# Patient Record
Sex: Female | Born: 1975 | Race: White | Hispanic: No | Marital: Single | Smoking: Never smoker
Health system: Southern US, Community
[De-identification: ages and names within clinical notes are randomized; demographics above are authoritative.]

## PROBLEM LIST (undated history)

## (undated) DIAGNOSIS — F32A Depression, unspecified: Secondary | ICD-10-CM

## (undated) DIAGNOSIS — F329 Major depressive disorder, single episode, unspecified: Secondary | ICD-10-CM

## (undated) HISTORY — DX: Major depressive disorder, single episode, unspecified: F32.9

## (undated) HISTORY — DX: Depression, unspecified: F32.A

---

## 2000-03-17 ENCOUNTER — Other Ambulatory Visit: Admission: RE | Admit: 2000-03-17 | Discharge: 2000-03-17 | Payer: Self-pay | Admitting: Emergency Medicine

## 2000-05-08 ENCOUNTER — Other Ambulatory Visit: Admission: RE | Admit: 2000-05-08 | Discharge: 2000-05-08 | Payer: Self-pay | Admitting: Obstetrics and Gynecology

## 2000-10-02 ENCOUNTER — Encounter: Admission: RE | Admit: 2000-10-02 | Discharge: 2000-10-02 | Payer: Self-pay | Admitting: Family Medicine

## 2000-10-02 ENCOUNTER — Encounter: Payer: Self-pay | Admitting: Family Medicine

## 2001-08-16 ENCOUNTER — Other Ambulatory Visit: Admission: RE | Admit: 2001-08-16 | Discharge: 2001-08-16 | Payer: Self-pay | Admitting: Obstetrics and Gynecology

## 2002-05-09 ENCOUNTER — Other Ambulatory Visit: Admission: RE | Admit: 2002-05-09 | Discharge: 2002-05-09 | Payer: Self-pay | Admitting: Obstetrics and Gynecology

## 2003-03-27 ENCOUNTER — Other Ambulatory Visit: Admission: RE | Admit: 2003-03-27 | Discharge: 2003-03-27 | Payer: Self-pay | Admitting: Obstetrics and Gynecology

## 2003-10-24 ENCOUNTER — Other Ambulatory Visit: Admission: RE | Admit: 2003-10-24 | Discharge: 2003-10-24 | Payer: Self-pay | Admitting: Obstetrics and Gynecology

## 2004-04-23 ENCOUNTER — Other Ambulatory Visit: Admission: RE | Admit: 2004-04-23 | Discharge: 2004-04-23 | Payer: Self-pay | Admitting: Obstetrics and Gynecology

## 2005-07-17 ENCOUNTER — Encounter: Admission: RE | Admit: 2005-07-17 | Discharge: 2005-07-17 | Payer: Self-pay | Admitting: Family Medicine

## 2012-04-28 ENCOUNTER — Other Ambulatory Visit (HOSPITAL_COMMUNITY): Payer: Self-pay | Admitting: Family Medicine

## 2012-04-28 ENCOUNTER — Other Ambulatory Visit: Payer: Self-pay | Admitting: Family Medicine

## 2012-04-28 ENCOUNTER — Ambulatory Visit
Admission: RE | Admit: 2012-04-28 | Discharge: 2012-04-28 | Disposition: A | Discharge status: Home / Self Care | Payer: PRIVATE HEALTH INSURANCE | Source: Ambulatory Visit | Attending: Family Medicine | Admitting: Family Medicine

## 2012-04-28 ENCOUNTER — Ambulatory Visit (HOSPITAL_COMMUNITY): Payer: PRIVATE HEALTH INSURANCE

## 2012-04-28 DIAGNOSIS — N83209 Unspecified ovarian cyst, unspecified side: Secondary | ICD-10-CM

## 2012-04-28 DIAGNOSIS — R1031 Right lower quadrant pain: Secondary | ICD-10-CM

## 2012-04-28 DIAGNOSIS — N83201 Unspecified ovarian cyst, right side: Secondary | ICD-10-CM

## 2012-04-28 MED ORDER — IOHEXOL 300 MG/ML  SOLN
100.0000 mL | Freq: Once | INTRAMUSCULAR | Status: AC | PRN
  Administered 2012-04-28: 100 mL via INTRAVENOUS

## 2012-04-28 MED ORDER — IOHEXOL 300 MG/ML  SOLN
40.0000 mL | Freq: Once | INTRAMUSCULAR | Status: AC | PRN
  Administered 2012-04-28: 40 mL via ORAL

## 2012-04-29 ENCOUNTER — Ambulatory Visit (HOSPITAL_COMMUNITY): Payer: PRIVATE HEALTH INSURANCE

## 2013-03-08 ENCOUNTER — Other Ambulatory Visit (HOSPITAL_COMMUNITY)
Admission: RE | Admit: 2013-03-08 | Discharge: 2013-03-08 | Disposition: A | Discharge status: Home / Self Care | Payer: BC Managed Care – PPO | Source: Ambulatory Visit | Attending: Obstetrics and Gynecology | Admitting: Obstetrics and Gynecology

## 2013-03-08 ENCOUNTER — Other Ambulatory Visit: Payer: Self-pay | Admitting: Obstetrics and Gynecology

## 2013-03-08 DIAGNOSIS — Z1151 Encounter for screening for human papillomavirus (HPV): Secondary | ICD-10-CM | POA: Insufficient documentation

## 2013-03-08 DIAGNOSIS — Z01419 Encounter for gynecological examination (general) (routine) without abnormal findings: Secondary | ICD-10-CM | POA: Insufficient documentation

## 2013-03-08 DIAGNOSIS — N760 Acute vaginitis: Secondary | ICD-10-CM | POA: Insufficient documentation

## 2013-03-08 DIAGNOSIS — R87810 Cervical high risk human papillomavirus (HPV) DNA test positive: Secondary | ICD-10-CM | POA: Insufficient documentation

## 2014-05-15 IMAGING — CT CT ABD-PELV W/ CM
2 of 4 series · 10 of 36 positions shown, 17 images · IV contrast (OMNI 300, WATER & [ID] OMNI 300)
Comparison: None.

CLINICAL DATA: Right lower quadrant pain.

CT ABDOMEN AND PELVIS WITH CONTRAST
TECHNIQUE: Multidetector CT imaging of the abdomen and pelvis was
performed following the standard protocol during bolus
administration of intravenous contrast.
Contrast: 40mL OMNIPAQUE IOHEXOL 300 MG/ML  SOLN, 100mL OMNIPAQUE
IOHEXOL 300 MG/ML  SOLN

[Series 3: abd/pelvis with · axial · 0.66mm/px · z∈[-272,+18]mm · 9 of 74 slices shown, 15 images]
[im 8/74  soft-tissue]
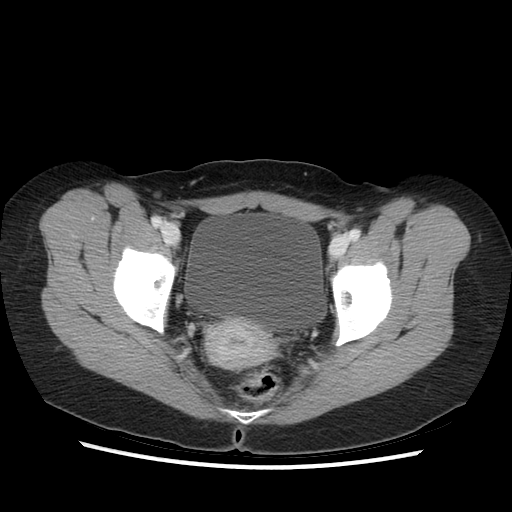
[im 8/74  bone]
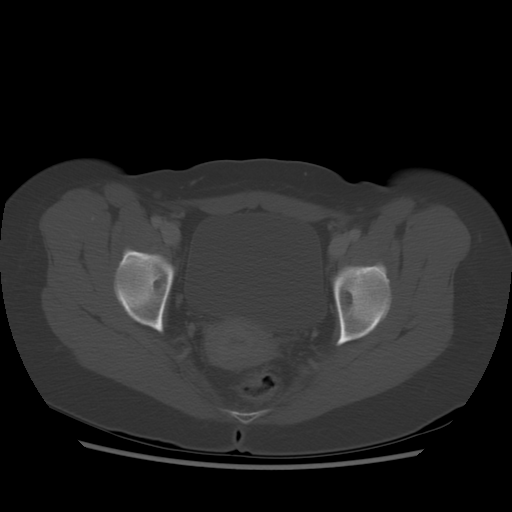
[im 15/74  soft-tissue]
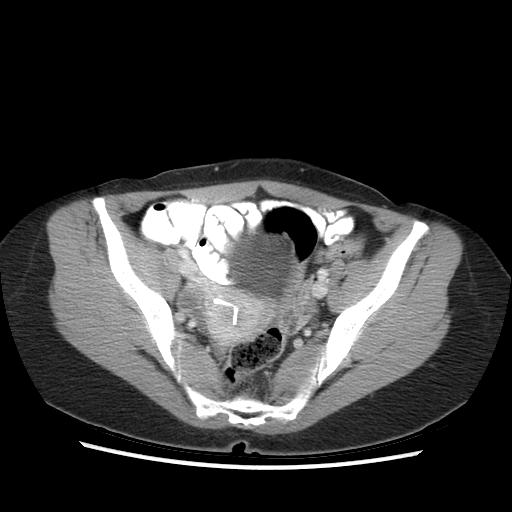
[im 22/74  soft-tissue]
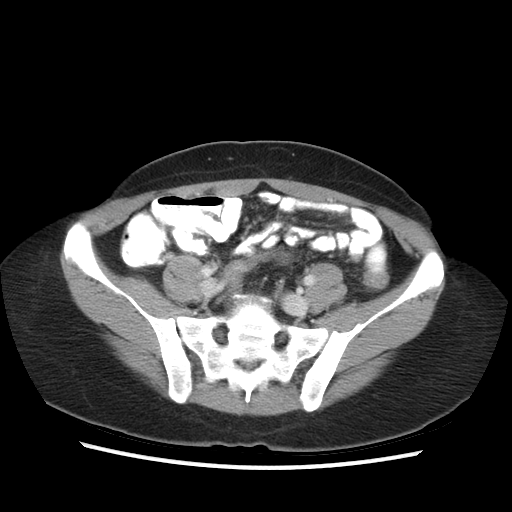
[im 30/74  soft-tissue]
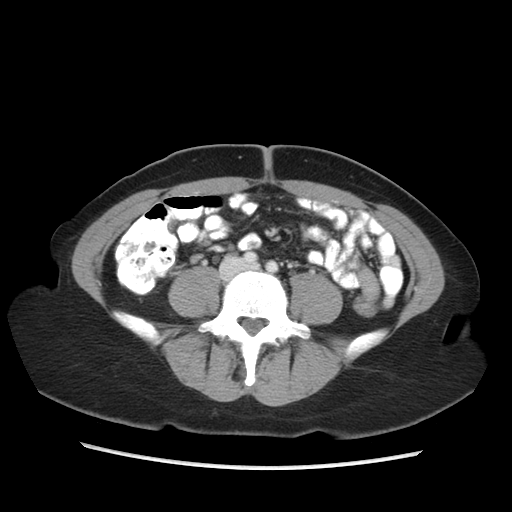
[im 37/74  soft-tissue]
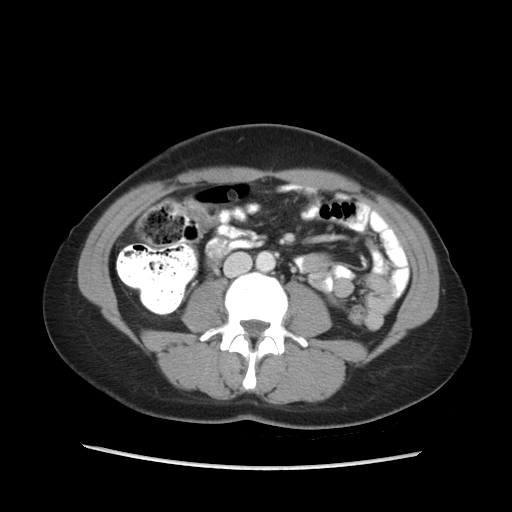
[im 44/74  soft-tissue]
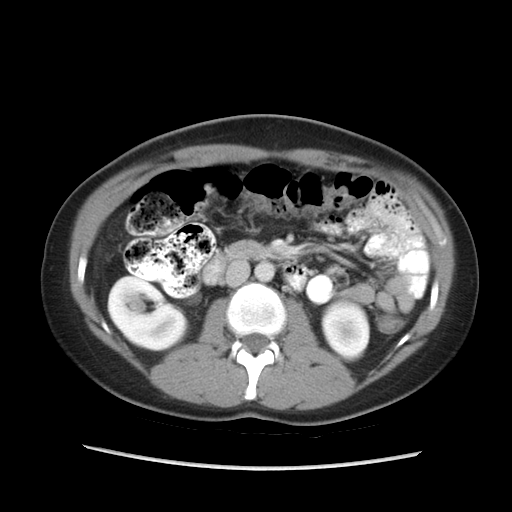
[im 44/74  lung]
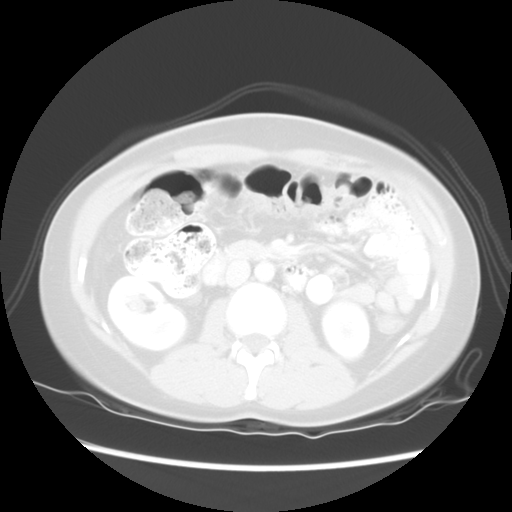
[im 52/74  soft-tissue]
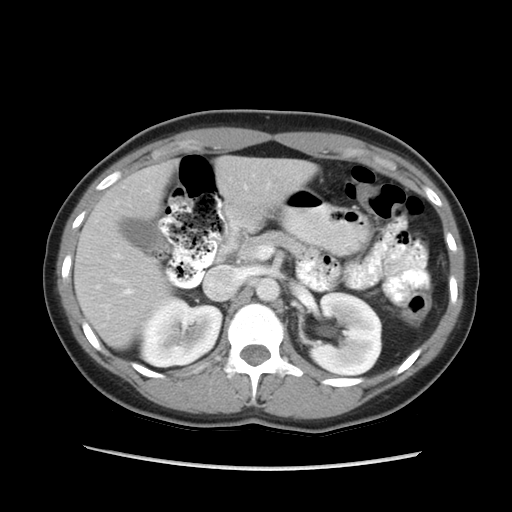
[im 52/74  lung]
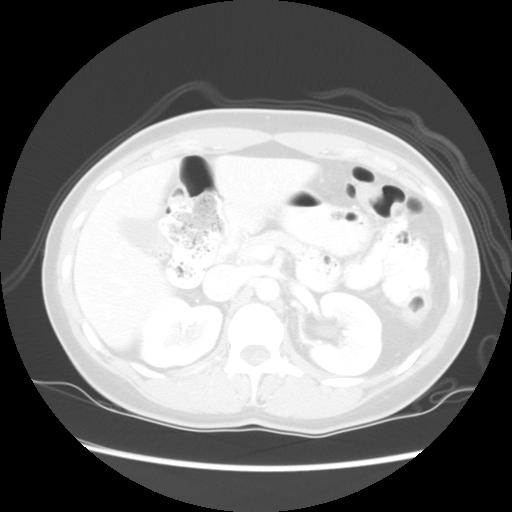
[im 59/74  soft-tissue]
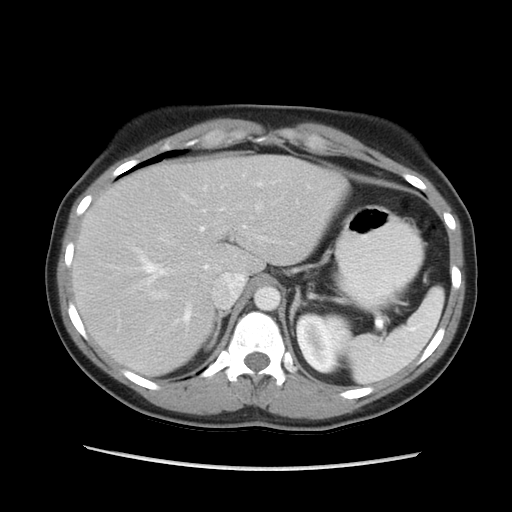
[im 59/74  lung]
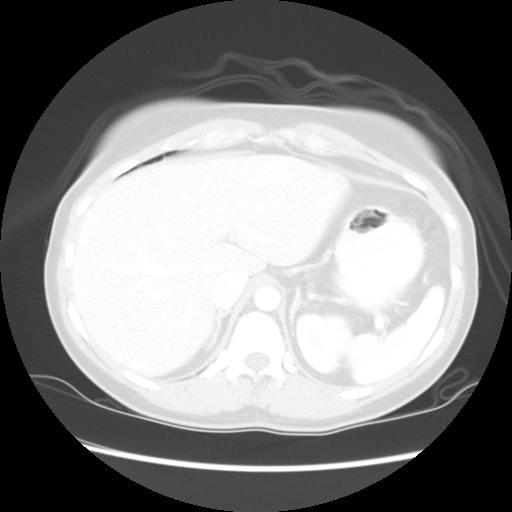
[im 66/74  soft-tissue]
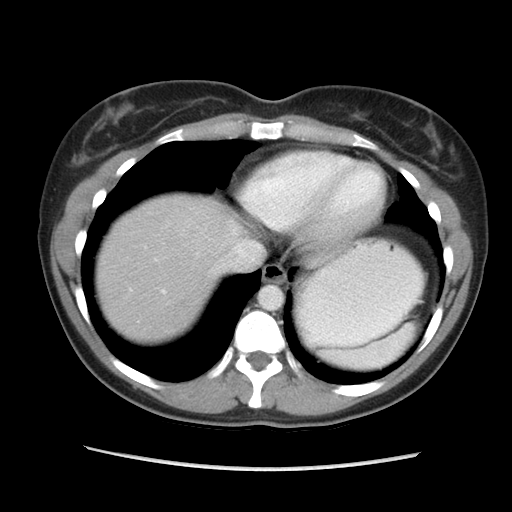
[im 66/74  lung]
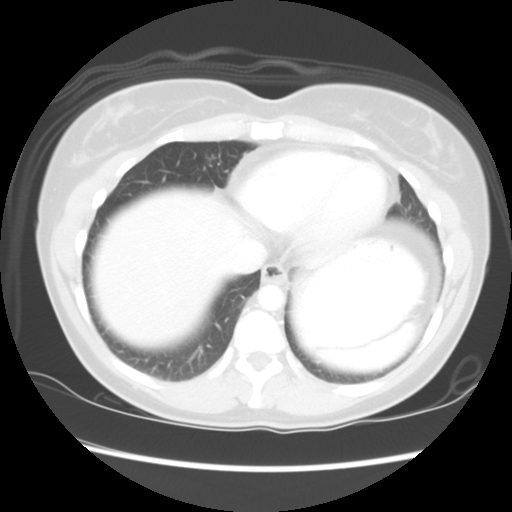
[im 66/74  bone]
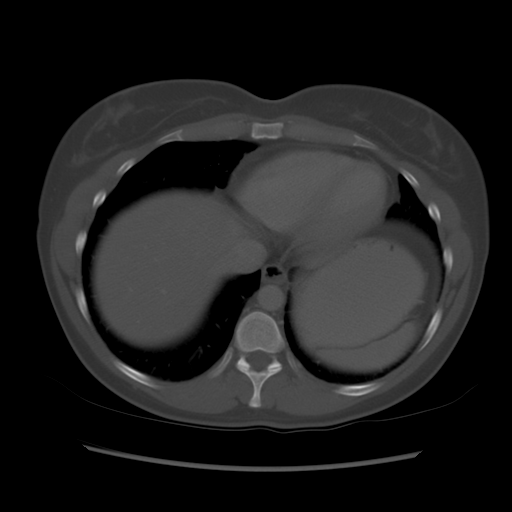

[Series 601: coronal body · coronal · 0.85mm/px · 1 of 106 slices shown, 2 images]
[im 36/106  soft-tissue]
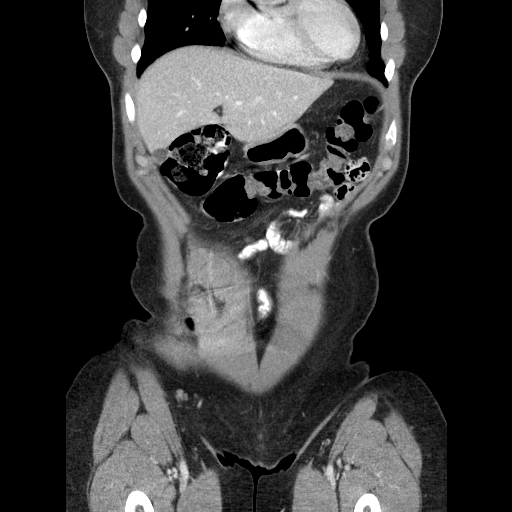
[im 36/106  bone]
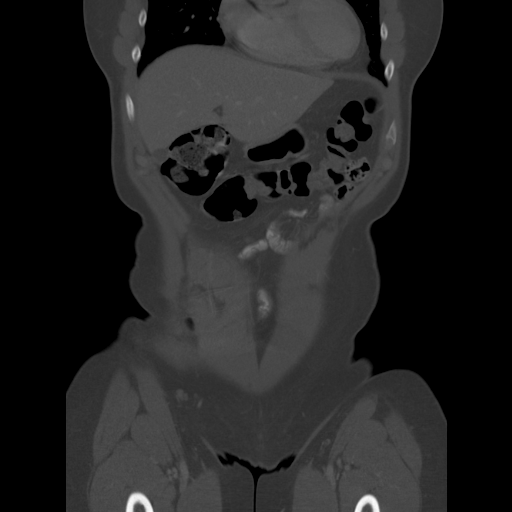

[10 of 36 positions shown; findings below may reference images not displayed]

FINDINGS: The abdominal parenchymal organs are normal in
appearance.  Gallbladder is unremarkable.  No evidence of
hydronephrosis.  No soft tissue masses or lymphadenopathy
identified.

Normal appendix is visualized on the coronal MPRs.  IUD is seen in
normal location in the uterus.  A simple appearing cyst is seen in
the right adnexa measuring approximately 2 x 3 cm, likely
physiologic ovarian cyst.  No evidence of inflammatory process,
bowel wall thickening, or dilatation.
IMPRESSION: 1.  2 x 3 cm right ovarian cyst, likely physiologic.
2.  IUD in normal location.
3.  No evidence of appendicitis or other significant abnormality.

## 2015-01-16 ENCOUNTER — Other Ambulatory Visit (HOSPITAL_COMMUNITY)
Admission: RE | Admit: 2015-01-16 | Discharge: 2015-01-16 | Disposition: A | Discharge status: Home / Self Care | Payer: BLUE CROSS/BLUE SHIELD | Source: Ambulatory Visit | Attending: Obstetrics and Gynecology | Admitting: Obstetrics and Gynecology

## 2015-01-16 ENCOUNTER — Other Ambulatory Visit: Payer: Self-pay | Admitting: Obstetrics and Gynecology

## 2015-01-16 DIAGNOSIS — Z01411 Encounter for gynecological examination (general) (routine) with abnormal findings: Secondary | ICD-10-CM | POA: Insufficient documentation

## 2015-01-16 DIAGNOSIS — Z1151 Encounter for screening for human papillomavirus (HPV): Secondary | ICD-10-CM | POA: Diagnosis present

## 2015-01-16 DIAGNOSIS — R87810 Cervical high risk human papillomavirus (HPV) DNA test positive: Secondary | ICD-10-CM | POA: Diagnosis present

## 2015-01-17 LAB — CYTOLOGY - PAP

## 2015-01-31 ENCOUNTER — Other Ambulatory Visit: Payer: Self-pay | Admitting: Obstetrics and Gynecology

## 2015-05-07 ENCOUNTER — Ambulatory Visit (INDEPENDENT_AMBULATORY_CARE_PROVIDER_SITE_OTHER): Payer: BLUE CROSS/BLUE SHIELD | Admitting: Physician Assistant

## 2015-05-07 VITALS — BP 110/70 | HR 84 | Temp 98.2°F | Resp 18 | Ht 59.0 in | Wt 121.0 lb

## 2015-05-07 DIAGNOSIS — M795 Residual foreign body in soft tissue: Secondary | ICD-10-CM

## 2015-05-07 DIAGNOSIS — L089 Local infection of the skin and subcutaneous tissue, unspecified: Secondary | ICD-10-CM | POA: Diagnosis not present

## 2015-05-07 MED ORDER — DOXYCYCLINE HYCLATE 100 MG PO CAPS: 100.0000 mg | ORAL_CAPSULE | Freq: Two times a day (BID) | ORAL | Status: DC

## 2015-05-07 NOTE — Progress Notes (Signed)
   Subjective:    Patient ID: Stacey [name], female    DOB: 03/13/1976, 39 y.o.   MRN: [id number]  HPI Patient presents to have 3 rings removed from left ring finger that have been stuck for past 2 days. Has been wearing for past week. Denies numbness, tingling, or loss of sensation/function. Finger has gotten red and is painful now. Had tdap 2 years ago. NKDA.   Review of Systems As noted above.    Objective:   Physical Exam  Constitutional: She is oriented to person, place, and time. She appears well-developed and well-nourished. No distress.  Blood pressure 110/70, pulse 84, temperature 98.2 F (36.8 C), temperature source Oral, resp. rate 18, height 4\' 11"  (1.499 m), weight 121 lb (54.885 kg), SpO2 99 %.   HENT:  Head: Normocephalic and atraumatic.  Right Ear: External ear normal.  Left Ear: External ear normal.  Eyes: Conjunctivae are normal. Right eye exhibits no discharge. Left eye exhibits no discharge. No scleral icterus.  Pulmonary/Chest: Effort normal.  Neurological: She is alert and oriented to person, place, and time.  Skin: Skin is warm and dry. No rash noted. She is not diaphoretic. There is erythema. No pallor.  Once rings removed, underlying skin red and green. Rings had embedded into skin so small ulceration without drainage present.   Psychiatric: She has a normal mood and affect. Her behavior is normal. Judgment and thought content normal.   Procedure Consent obtained. Attempt at removing rings with pinrose drain failed. Cut all 3 rings off. Finger thoroughly examined.     Assessment & Plan:  1. Infection of skin of finger 2. Foreign body (FB) in soft tissue Rings removed. Ibuprofen for pain. - doxycycline (VIBRAMYCIN) 100 MG capsule; Take 1 capsule (100 mg total) by mouth 2 (two) times daily.  Dispense: 20 capsule; Refill: 0   Tangia Pinard PA-C  [facility] 05/07/2015 7:16 PM

## 2015-10-03 ENCOUNTER — Other Ambulatory Visit: Payer: Self-pay

## 2015-10-03 DIAGNOSIS — Z1231 Encounter for screening mammogram for malignant neoplasm of breast: Secondary | ICD-10-CM

## 2015-12-10 ENCOUNTER — Ambulatory Visit
Admission: RE | Admit: 2015-12-10 | Discharge: 2015-12-10 | Disposition: A | Discharge status: Home / Self Care | Payer: BLUE CROSS/BLUE SHIELD | Source: Ambulatory Visit

## 2015-12-10 DIAGNOSIS — Z1231 Encounter for screening mammogram for malignant neoplasm of breast: Secondary | ICD-10-CM

## 2016-02-07 ENCOUNTER — Other Ambulatory Visit: Payer: Self-pay | Admitting: Obstetrics and Gynecology

## 2016-02-07 ENCOUNTER — Other Ambulatory Visit (HOSPITAL_COMMUNITY)
Admission: RE | Admit: 2016-02-07 | Discharge: 2016-02-07 | Disposition: A | Discharge status: Home / Self Care | Payer: BLUE CROSS/BLUE SHIELD | Source: Ambulatory Visit | Attending: Obstetrics and Gynecology | Admitting: Obstetrics and Gynecology

## 2016-02-07 DIAGNOSIS — Z01411 Encounter for gynecological examination (general) (routine) with abnormal findings: Secondary | ICD-10-CM | POA: Insufficient documentation

## 2016-02-07 DIAGNOSIS — Z1151 Encounter for screening for human papillomavirus (HPV): Secondary | ICD-10-CM | POA: Diagnosis not present

## 2016-02-08 LAB — CYTOLOGY - PAP

## 2016-09-30 ENCOUNTER — Other Ambulatory Visit (HOSPITAL_COMMUNITY)
Admission: RE | Admit: 2016-09-30 | Discharge: 2016-09-30 | Disposition: A | Discharge status: Home / Self Care | Payer: PRIVATE HEALTH INSURANCE | Source: Ambulatory Visit | Attending: Obstetrics and Gynecology | Admitting: Obstetrics and Gynecology

## 2016-09-30 ENCOUNTER — Other Ambulatory Visit: Payer: Self-pay | Admitting: Obstetrics and Gynecology

## 2016-09-30 DIAGNOSIS — Z1151 Encounter for screening for human papillomavirus (HPV): Secondary | ICD-10-CM | POA: Diagnosis not present

## 2016-09-30 DIAGNOSIS — Z01411 Encounter for gynecological examination (general) (routine) with abnormal findings: Secondary | ICD-10-CM | POA: Insufficient documentation

## 2016-10-03 LAB — CYTOLOGY - PAP
Diagnosis: UNDETERMINED — AB
HPV (WINDOPATH): DETECTED — AB

## 2017-02-16 ENCOUNTER — Other Ambulatory Visit: Payer: Self-pay | Admitting: Obstetrics and Gynecology

## 2017-02-16 ENCOUNTER — Other Ambulatory Visit (HOSPITAL_COMMUNITY)
Admission: RE | Admit: 2017-02-16 | Discharge: 2017-02-16 | Disposition: A | Discharge status: Home / Self Care | Payer: PRIVATE HEALTH INSURANCE | Source: Ambulatory Visit | Attending: Obstetrics and Gynecology | Admitting: Obstetrics and Gynecology

## 2017-02-16 DIAGNOSIS — Z1151 Encounter for screening for human papillomavirus (HPV): Secondary | ICD-10-CM | POA: Insufficient documentation

## 2017-02-16 DIAGNOSIS — Z01411 Encounter for gynecological examination (general) (routine) with abnormal findings: Secondary | ICD-10-CM | POA: Insufficient documentation

## 2017-02-16 DIAGNOSIS — R87810 Cervical high risk human papillomavirus (HPV) DNA test positive: Secondary | ICD-10-CM | POA: Diagnosis present

## 2017-02-19 LAB — CYTOLOGY - PAP
Diagnosis: UNDETERMINED — AB
HPV 16/18/45 GENOTYPING: POSITIVE — AB
HPV: DETECTED — AB

## 2017-03-24 ENCOUNTER — Other Ambulatory Visit: Payer: Self-pay | Admitting: Obstetrics and Gynecology

## 2018-02-18 ENCOUNTER — Other Ambulatory Visit: Payer: Self-pay | Admitting: Obstetrics and Gynecology

## 2018-02-18 ENCOUNTER — Other Ambulatory Visit (HOSPITAL_COMMUNITY)
Admission: RE | Admit: 2018-02-18 | Discharge: 2018-02-18 | Disposition: A | Discharge status: Home / Self Care | Payer: PRIVATE HEALTH INSURANCE | Source: Ambulatory Visit | Attending: Obstetrics and Gynecology | Admitting: Obstetrics and Gynecology

## 2018-02-18 DIAGNOSIS — Z01411 Encounter for gynecological examination (general) (routine) with abnormal findings: Secondary | ICD-10-CM | POA: Insufficient documentation

## 2018-02-23 LAB — CYTOLOGY - PAP
DIAGNOSIS: NEGATIVE
HPV (WINDOPATH): NOT DETECTED

## 2018-08-26 ENCOUNTER — Other Ambulatory Visit: Payer: Self-pay

## 2018-08-26 MED ORDER — BUPROPION HCL ER (SR) 150 MG PO TB12: 150.0000 mg | ORAL_TABLET | ORAL | 0 refills | Status: DC

## 2018-08-26 NOTE — Progress Notes (Signed)
Sent 90 day supply to [facility]

## 2018-12-20 ENCOUNTER — Other Ambulatory Visit: Payer: Self-pay | Admitting: Psychiatry

## 2018-12-20 NOTE — Telephone Encounter (Signed)
Need which medications, which mail order pharmacy and the paper chart not seen in epic

## 2018-12-20 NOTE — Telephone Encounter (Signed)
Pt called stated need refills on all meds mail order. Call [phone] with questions

## 2018-12-21 MED ORDER — BENZTROPINE MESYLATE 1 MG PO TABS: 1.0000 mg | ORAL_TABLET | Freq: Two times a day (BID) | ORAL | 2 refills | Status: DC

## 2018-12-21 MED ORDER — LORAZEPAM 1 MG PO TABS: ORAL_TABLET | ORAL | 0 refills | Status: DC

## 2018-12-21 MED ORDER — ZIPRASIDONE HCL 80 MG PO CAPS: 80.0000 mg | ORAL_CAPSULE | Freq: Two times a day (BID) | ORAL | 2 refills | Status: DC

## 2018-12-21 MED ORDER — BUPROPION HCL ER (XL) 150 MG PO TB24: 150.0000 mg | ORAL_TABLET | Freq: Every day | ORAL | 2 refills | Status: DC

## 2018-12-21 NOTE — Telephone Encounter (Signed)
Needs sent to [facility] and confirmed Benztropine, lorazepam, Zipradisone, and Bupropion XL  Will submit for 90 day and refills pt comes yearly for OV last seen 07/13/2018

## 2018-12-21 NOTE — Telephone Encounter (Signed)
Refill request sent to [facility] for her medications, pt comes yearly for OV

## 2019-05-05 ENCOUNTER — Other Ambulatory Visit: Payer: Self-pay

## 2019-05-05 ENCOUNTER — Telehealth: Payer: Self-pay | Admitting: Psychiatry

## 2019-05-05 MED ORDER — LORAZEPAM 1 MG PO TABS: ORAL_TABLET | ORAL | 0 refills | Status: DC

## 2019-05-05 MED ORDER — BUPROPION HCL ER (XL) 150 MG PO TB24: 150.0000 mg | ORAL_TABLET | Freq: Every day | ORAL | 0 refills | Status: DC

## 2019-05-05 NOTE — Telephone Encounter (Signed)
Patient called and said that she needs refills on her lorazapam 1 mg and her welbutrin 150 mg to be sent to [facility]. She does not have insurance.

## 2019-05-05 NOTE — Telephone Encounter (Signed)
Will pend for approval, not sure when last visit was not seen in epic.

## 2019-06-22 ENCOUNTER — Telehealth: Payer: Self-pay | Admitting: Psychiatry

## 2019-06-22 ENCOUNTER — Other Ambulatory Visit: Payer: Self-pay

## 2019-06-22 MED ORDER — ZIPRASIDONE HCL 80 MG PO CAPS: 80.0000 mg | ORAL_CAPSULE | Freq: Two times a day (BID) | ORAL | 0 refills | Status: DC

## 2019-06-22 NOTE — Telephone Encounter (Signed)
Stacey [name] called to request refill of her Geodon.  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  Has appt 07/12/19.  Please send to [facility]

## 2019-06-22 NOTE — Telephone Encounter (Signed)
Refill submitted. 

## 2019-07-12 ENCOUNTER — Ambulatory Visit: Payer: Self-pay | Admitting: Psychiatry

## 2019-07-19 ENCOUNTER — Other Ambulatory Visit: Payer: Self-pay

## 2019-07-19 ENCOUNTER — Encounter: Payer: Self-pay | Admitting: Psychiatry

## 2019-07-19 ENCOUNTER — Ambulatory Visit (INDEPENDENT_AMBULATORY_CARE_PROVIDER_SITE_OTHER): Payer: Self-pay | Admitting: Psychiatry

## 2019-07-19 DIAGNOSIS — G2589 Other specified extrapyramidal and movement disorders: Secondary | ICD-10-CM

## 2019-07-19 DIAGNOSIS — F411 Generalized anxiety disorder: Secondary | ICD-10-CM

## 2019-07-19 DIAGNOSIS — F203 Undifferentiated schizophrenia: Secondary | ICD-10-CM

## 2019-07-19 DIAGNOSIS — T50905A Adverse effect of unspecified drugs, medicaments and biological substances, initial encounter: Secondary | ICD-10-CM

## 2019-07-19 DIAGNOSIS — F325 Major depressive disorder, single episode, in full remission: Secondary | ICD-10-CM

## 2019-07-19 MED ORDER — BUPROPION HCL ER (XL) 150 MG PO TB24: 150.0000 mg | ORAL_TABLET | Freq: Every day | ORAL | 3 refills | Status: DC

## 2019-07-19 MED ORDER — BENZTROPINE MESYLATE 1 MG PO TABS: 1.0000 mg | ORAL_TABLET | Freq: Two times a day (BID) | ORAL | 3 refills | Status: DC

## 2019-07-19 MED ORDER — ZIPRASIDONE HCL 80 MG PO CAPS: 80.0000 mg | ORAL_CAPSULE | Freq: Two times a day (BID) | ORAL | 3 refills | Status: DC

## 2019-07-19 MED ORDER — LORAZEPAM 1 MG PO TABS: ORAL_TABLET | ORAL | 1 refills | Status: DC
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
## 2020-02-22 ENCOUNTER — Other Ambulatory Visit: Payer: Self-pay | Admitting: Psychiatry

## 2020-02-22 DIAGNOSIS — F411 Generalized anxiety disorder: Secondary | ICD-10-CM

## 2020-02-22 NOTE — Telephone Encounter (Signed)
Next apt 07/18/2020, 90 day supply

## 2020-04-04 ENCOUNTER — Telehealth: Payer: Self-pay | Admitting: Psychiatry

## 2020-04-04 NOTE — Telephone Encounter (Signed)
Patient is scheduled for this Friday at 3: 30pm

## 2020-04-04 NOTE — Telephone Encounter (Signed)
I need to see her as soon as possible. Schedule her as a work in Friday afternoon

## 2020-04-04 NOTE — Telephone Encounter (Signed)
Pt mother called and said that her daughter is really struggling right now. Mother said that she is constantly saying that people are messing with her. Pt is not herself. Please call.

## 2020-04-06 ENCOUNTER — Encounter: Payer: Self-pay | Admitting: Psychiatry

## 2020-04-06 ENCOUNTER — Ambulatory Visit (INDEPENDENT_AMBULATORY_CARE_PROVIDER_SITE_OTHER): Payer: Self-pay | Admitting: Psychiatry

## 2020-04-06 ENCOUNTER — Other Ambulatory Visit: Payer: Self-pay

## 2020-04-06 DIAGNOSIS — F411 Generalized anxiety disorder: Secondary | ICD-10-CM

## 2020-04-06 DIAGNOSIS — G2589 Other specified extrapyramidal and movement disorders: Secondary | ICD-10-CM

## 2020-04-06 DIAGNOSIS — F203 Undifferentiated schizophrenia: Secondary | ICD-10-CM

## 2020-04-06 MED ORDER — OLANZAPINE 5 MG PO TABS: 5.0000 mg | ORAL_TABLET | Freq: Every day | ORAL | 0 refills | Status: DC
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
## 2020-04-10 ENCOUNTER — Telehealth: Payer: Self-pay | Admitting: Psychiatry

## 2020-04-10 NOTE — Telephone Encounter (Signed)
LM x 2 and that I would try to reach her tomorrow. I'm calling the mobile number listed.

## 2020-04-10 NOTE — Telephone Encounter (Signed)
LM for her to call back. I did not block my number so our office # appeared when I called, hopefully she will call back soon.

## 2020-04-10 NOTE — Telephone Encounter (Signed)
Pt called and stated since starting ZYPREXA, things have gotten better but she is still having the same issues. Pt was told to call to update on how she was doing since starting the medication.

## 2020-04-10 NOTE — Telephone Encounter (Signed)
Patient seen on 04/06/2020.  She had been stable on Geodon 80 mg twice daily for years.  Recently he has not been able to eat or sleep well therefore Geodon absorption has been impaired because of her difficulty eating.  Started olanzapine 5 mg nightly to help address those problems.  Expect that we will be temporary.  Please asked the patient if she is now eating well with the ziprasidone and sleeping well due to the olanzapine.  If those are significantly improved then we can wait till the end of the week to see if symptoms resolve.  However if she still having significant problems with sleep or appetite then we should increase olanzapine.

## 2020-04-13 ENCOUNTER — Other Ambulatory Visit: Payer: Self-pay

## 2020-04-13 ENCOUNTER — Ambulatory Visit (INDEPENDENT_AMBULATORY_CARE_PROVIDER_SITE_OTHER): Payer: Self-pay | Admitting: Psychiatry

## 2020-04-13 ENCOUNTER — Encounter: Payer: Self-pay | Admitting: Psychiatry

## 2020-04-13 DIAGNOSIS — F325 Major depressive disorder, single episode, in full remission: Secondary | ICD-10-CM

## 2020-04-13 DIAGNOSIS — F411 Generalized anxiety disorder: Secondary | ICD-10-CM

## 2020-04-13 DIAGNOSIS — F203 Undifferentiated schizophrenia: Secondary | ICD-10-CM
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
## 2020-04-13 NOTE — Patient Instructions (Signed)
Increase olanzapine to 2 of the 5 mg tablets about 2-3 hours before sleep.

## 2020-04-24 ENCOUNTER — Telehealth: Payer: Self-pay | Admitting: Psychiatry

## 2020-04-24 ENCOUNTER — Other Ambulatory Visit: Payer: Self-pay

## 2020-04-24 MED ORDER — OLANZAPINE 10 MG PO TABS: 10.0000 mg | ORAL_TABLET | Freq: Every day | ORAL | 1 refills | Status: DC

## 2020-04-24 NOTE — Telephone Encounter (Signed)
Rx updated to Zyprexa 10 mg #[facility]

## 2020-04-24 NOTE — Telephone Encounter (Signed)
Patient called and said that she needs a refill on her zyprexa. She saw Dr. Jennelle Human on 5/21 and he increased it to 10 mg. [facility] [address] She is out and her next appt is Friday 6/4

## 2020-04-27 ENCOUNTER — Encounter: Payer: Self-pay | Admitting: Psychiatry

## 2020-04-27 ENCOUNTER — Ambulatory Visit (INDEPENDENT_AMBULATORY_CARE_PROVIDER_SITE_OTHER): Payer: Self-pay | Admitting: Psychiatry

## 2020-04-27 ENCOUNTER — Other Ambulatory Visit: Payer: Self-pay

## 2020-04-27 DIAGNOSIS — F411 Generalized anxiety disorder: Secondary | ICD-10-CM

## 2020-04-27 DIAGNOSIS — F325 Major depressive disorder, single episode, in full remission: Secondary | ICD-10-CM

## 2020-04-27 DIAGNOSIS — F203 Undifferentiated schizophrenia: Secondary | ICD-10-CM
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
## 2020-04-30 ENCOUNTER — Other Ambulatory Visit: Payer: Self-pay | Admitting: Psychiatry

## 2020-04-30 ENCOUNTER — Telehealth: Payer: Self-pay | Admitting: Psychiatry

## 2020-04-30 NOTE — Telephone Encounter (Signed)
[facility], but she doesn't need a 90 day supply. They told her we should send in a new script for 20mg  and indicate she does not need a 90 day.  She said she will be stopping the medication and doesn't need 90 day.  This doesn't make any sense.  She doesn't have insurance.  Why are they requiring 90 day?  And why would we increase the mg if she is going to go off the medication?  Either way, the pharmacy is not filling [facility] prescription[facility] [address]

## 2020-04-30 NOTE — Telephone Encounter (Signed)
Since she does not have insurance [facility]!  They will overcharged her.  She needs to pick [facility] any other pharmacy except [facility] and we can send in prescription for olanzapine 10 mg 1 nightly #30 The patient remains paranoid and may be misunderstanding some of what she is hearing from the pharmacist.

## 2020-04-30 NOTE — Telephone Encounter (Signed)
LM with details and to call back with a different pharmacy

## 2020-05-01 ENCOUNTER — Encounter: Payer: Self-pay | Admitting: Psychiatry

## 2020-05-01 ENCOUNTER — Other Ambulatory Visit: Payer: Self-pay

## 2020-05-01 MED ORDER — OLANZAPINE 10 MG PO TABS: 10.0000 mg | ORAL_TABLET | Freq: Every day | ORAL | 1 refills | Status: DC

## 2020-05-01 NOTE — Telephone Encounter (Signed)
PT lm to send rx for to [facility] in [place].

## 2020-05-01 NOTE — Telephone Encounter (Signed)
RX sent to [facility].

## 2020-05-11 ENCOUNTER — Other Ambulatory Visit: Payer: Self-pay

## 2020-05-11 ENCOUNTER — Ambulatory Visit (INDEPENDENT_AMBULATORY_CARE_PROVIDER_SITE_OTHER): Payer: Self-pay | Admitting: Psychiatry

## 2020-05-11 ENCOUNTER — Encounter: Payer: Self-pay | Admitting: Psychiatry

## 2020-05-11 DIAGNOSIS — F203 Undifferentiated schizophrenia: Secondary | ICD-10-CM

## 2020-05-11 DIAGNOSIS — F411 Generalized anxiety disorder: Secondary | ICD-10-CM

## 2020-05-11 DIAGNOSIS — F325 Major depressive disorder, single episode, in full remission: Secondary | ICD-10-CM
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
## 2020-05-24 ENCOUNTER — Telehealth: Payer: Self-pay

## 2020-05-24 NOTE — Telephone Encounter (Signed)
Mom, Marylene Land called very concerned and she knows [place] has apt tomorrow. She does not want patient to know she called though. Presently living with Mom and Dad and they hardly even see her, she stays in her room and comes out occasionally to get food or do her laundry. Sometimes she will speak to them sometimes not a word. If they offer her to have a meal with them she may eat very little portions but will not sit with them at the table. Remains extremely paranoid, doesn't trust anyone. Mom is concerned she's not eating enough with her medication, she assumes she is taking it. Patient tells Mom she is. Patient says " I am eating, I gained so much weight" Mom just wants to know when they should see improvement, months? They just want her to get better. She's obviously not able to work nor would even be able to go to job interview. Patient gets panicky if she is around anyone. Occasionally will tell her Dad's car, they think she goes to a fast food place but unsure.

## 2020-05-25 ENCOUNTER — Ambulatory Visit: Payer: Self-pay | Admitting: Psychiatry

## 2020-05-25 ENCOUNTER — Encounter: Payer: Self-pay | Admitting: Psychiatry

## 2020-05-25 ENCOUNTER — Other Ambulatory Visit: Payer: Self-pay

## 2020-05-25 ENCOUNTER — Ambulatory Visit (INDEPENDENT_AMBULATORY_CARE_PROVIDER_SITE_OTHER): Payer: Self-pay | Admitting: Psychiatry

## 2020-05-25 DIAGNOSIS — F411 Generalized anxiety disorder: Secondary | ICD-10-CM

## 2020-05-25 DIAGNOSIS — F203 Undifferentiated schizophrenia: Secondary | ICD-10-CM

## 2020-05-25 MED ORDER — OLANZAPINE 20 MG PO TABS: 20.0000 mg | ORAL_TABLET | Freq: Every day | ORAL | 1 refills | Status: DC
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
## 2020-05-25 NOTE — Patient Instructions (Signed)
Call next Wed if anxiety is not better.

## 2020-06-06 ENCOUNTER — Telehealth: Payer: Self-pay | Admitting: Psychiatry

## 2020-06-06 NOTE — Telephone Encounter (Signed)
Pt left message stating having anxiety problems today. Ask nurse to return call  @ [phone]

## 2020-06-06 NOTE — Telephone Encounter (Signed)
Patient returned your call. Please call her back at [phone]

## 2020-06-06 NOTE — Telephone Encounter (Deleted)
LM to return my call. 

## 2020-06-11 NOTE — Telephone Encounter (Signed)
This note was on the incorrect pt.  Correct pt was addressed.

## 2020-06-12 ENCOUNTER — Telehealth: Payer: Self-pay

## 2020-06-12 NOTE — Telephone Encounter (Signed)
LM for patient to call back with an update on how she's doing.

## 2020-06-13 ENCOUNTER — Other Ambulatory Visit: Payer: Self-pay | Admitting: Psychiatry

## 2020-06-13 NOTE — Telephone Encounter (Signed)
I am sorry the olanzapine is not having more benefit at this time.  We will look at making a more significant change when I see here this week but in the meantime the most helpful thing is probably just to continue the current meds and take the lorazepam for her anxiety.  We could increase the dosage of that temporarily if she thinks it would be helpful and not make her too sleepy.  She could go up to 1 mg 3 times daily.

## 2020-06-13 NOTE — Telephone Encounter (Signed)
Patient called back and reported she's just still so anxious, everywhere she goes and at home. There's no way she could possibly get a job at this time but knows she needs one. She has gained weight from the medication so she is eating. Sleep is better, occasionally she has an issue of trouble falling asleep but not too often. Once she's asleep she does not wake up.   Taking olanzapine 20 mg 2-3 hours before bed. Geodon 80 mg bid, and alprazolam and benztropine as prescribed.   Has apt this Friday 07/23. Recommend any changes?

## 2020-06-13 NOTE — Telephone Encounter (Signed)
Left voicemail with information and to call back with any questions or concerns.

## 2020-06-15 ENCOUNTER — Encounter: Payer: Self-pay | Admitting: Psychiatry

## 2020-06-15 ENCOUNTER — Ambulatory Visit (INDEPENDENT_AMBULATORY_CARE_PROVIDER_SITE_OTHER): Payer: Self-pay | Admitting: Psychiatry

## 2020-06-15 ENCOUNTER — Other Ambulatory Visit: Payer: Self-pay

## 2020-06-15 DIAGNOSIS — F325 Major depressive disorder, single episode, in full remission: Secondary | ICD-10-CM

## 2020-06-15 DIAGNOSIS — F411 Generalized anxiety disorder: Secondary | ICD-10-CM

## 2020-06-15 DIAGNOSIS — F203 Undifferentiated schizophrenia: Secondary | ICD-10-CM
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
## 2020-06-15 NOTE — Patient Instructions (Signed)
Stop Wellbutrin  Reduce caffeine to 2 Dr. Alcus Dad daily

## 2020-06-19 ENCOUNTER — Telehealth: Payer: Self-pay

## 2020-06-19 NOTE — Telephone Encounter (Signed)
error 

## 2020-06-21 ENCOUNTER — Telehealth: Payer: Self-pay | Admitting: Psychiatry

## 2020-06-22 ENCOUNTER — Ambulatory Visit (INDEPENDENT_AMBULATORY_CARE_PROVIDER_SITE_OTHER): Payer: Self-pay | Admitting: Psychiatry

## 2020-06-22 ENCOUNTER — Encounter: Payer: Self-pay | Admitting: Psychiatry

## 2020-06-22 ENCOUNTER — Other Ambulatory Visit: Payer: Self-pay

## 2020-06-22 DIAGNOSIS — F331 Major depressive disorder, recurrent, moderate: Secondary | ICD-10-CM

## 2020-06-22 DIAGNOSIS — F411 Generalized anxiety disorder: Secondary | ICD-10-CM

## 2020-06-22 DIAGNOSIS — F203 Undifferentiated schizophrenia: Secondary | ICD-10-CM

## 2020-06-22 MED ORDER — RISPERIDONE 1 MG PO TABS: ORAL_TABLET | ORAL | 1 refills | Status: DC

## 2020-06-22 NOTE — Telephone Encounter (Signed)
Error
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
## 2020-06-23 ENCOUNTER — Encounter: Payer: Self-pay | Admitting: Psychiatry

## 2020-07-06 ENCOUNTER — Other Ambulatory Visit: Payer: Self-pay

## 2020-07-06 ENCOUNTER — Encounter: Payer: Self-pay | Admitting: Psychiatry

## 2020-07-06 ENCOUNTER — Ambulatory Visit (INDEPENDENT_AMBULATORY_CARE_PROVIDER_SITE_OTHER): Payer: Self-pay | Admitting: Psychiatry

## 2020-07-06 DIAGNOSIS — F411 Generalized anxiety disorder: Secondary | ICD-10-CM

## 2020-07-06 DIAGNOSIS — F203 Undifferentiated schizophrenia: Secondary | ICD-10-CM

## 2020-07-06 DIAGNOSIS — F331 Major depressive disorder, recurrent, moderate: Secondary | ICD-10-CM
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
## 2020-07-11 ENCOUNTER — Telehealth: Payer: Self-pay | Admitting: Psychiatry

## 2020-07-11 NOTE — Telephone Encounter (Signed)
Increase risperidone to 1 mg in the morning and 3 mg nightly for her anxiety.

## 2020-07-11 NOTE — Telephone Encounter (Signed)
Stacey [name] called because she was here Friday and was doing much better, but yesterday and today her anxiety is sky high; didn't sleep well last night.  Doesn't know what to do.  Please call.  Appt 9/3

## 2020-07-11 NOTE — Telephone Encounter (Signed)
Patient aware of increase. Verbalized understanding and will call back if not improving.

## 2020-07-16 ENCOUNTER — Other Ambulatory Visit: Payer: Self-pay

## 2020-07-16 ENCOUNTER — Telehealth: Payer: Self-pay | Admitting: Psychiatry

## 2020-07-16 DIAGNOSIS — F203 Undifferentiated schizophrenia: Secondary | ICD-10-CM

## 2020-07-16 MED ORDER — RISPERIDONE 1 MG PO TABS: ORAL_TABLET | ORAL | 0 refills | Status: DC

## 2020-07-16 NOTE — Telephone Encounter (Signed)
Pt called about Rx for Risperal 1 mg tab 1 AM and 3 @ Night. CC increased night to 3 was 2. Pt at [facility] and needs approval.

## 2020-07-16 NOTE — Telephone Encounter (Signed)
New dose updated for [facility]

## 2020-07-18 ENCOUNTER — Ambulatory Visit: Payer: Self-pay | Admitting: Psychiatry

## 2020-07-27 ENCOUNTER — Ambulatory Visit (INDEPENDENT_AMBULATORY_CARE_PROVIDER_SITE_OTHER): Payer: Self-pay | Admitting: Psychiatry

## 2020-07-27 ENCOUNTER — Encounter: Payer: Self-pay | Admitting: Psychiatry

## 2020-07-27 ENCOUNTER — Other Ambulatory Visit: Payer: Self-pay | Admitting: Psychiatry

## 2020-07-27 ENCOUNTER — Other Ambulatory Visit: Payer: Self-pay

## 2020-07-27 DIAGNOSIS — F203 Undifferentiated schizophrenia: Secondary | ICD-10-CM

## 2020-07-27 DIAGNOSIS — G2589 Other specified extrapyramidal and movement disorders: Secondary | ICD-10-CM

## 2020-07-27 DIAGNOSIS — F411 Generalized anxiety disorder: Secondary | ICD-10-CM

## 2020-07-27 DIAGNOSIS — F5105 Insomnia due to other mental disorder: Secondary | ICD-10-CM

## 2020-07-27 MED ORDER — RISPERIDONE 1 MG PO TABS: ORAL_TABLET | ORAL | 1 refills | Status: DC

## 2020-07-27 MED ORDER — LORAZEPAM 1 MG PO TABS: ORAL_TABLET | ORAL | 0 refills | Status: DC

## 2020-07-27 MED ORDER — ZIPRASIDONE HCL 80 MG PO CAPS: 80.0000 mg | ORAL_CAPSULE | Freq: Two times a day (BID) | ORAL | 0 refills | Status: DC

## 2020-07-27 MED ORDER — BENZTROPINE MESYLATE 1 MG PO TABS: 1.0000 mg | ORAL_TABLET | Freq: Two times a day (BID) | ORAL | 3 refills | Status: DC
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
## 2020-08-13 ENCOUNTER — Other Ambulatory Visit: Payer: Self-pay | Admitting: Psychiatry

## 2020-08-13 DIAGNOSIS — F411 Generalized anxiety disorder: Secondary | ICD-10-CM

## 2020-08-17 ENCOUNTER — Other Ambulatory Visit: Payer: Self-pay

## 2020-08-17 ENCOUNTER — Encounter: Payer: Self-pay | Admitting: Psychiatry

## 2020-08-17 ENCOUNTER — Ambulatory Visit (INDEPENDENT_AMBULATORY_CARE_PROVIDER_SITE_OTHER): Payer: Self-pay | Admitting: Psychiatry

## 2020-08-17 DIAGNOSIS — F200 Paranoid schizophrenia: Secondary | ICD-10-CM

## 2020-08-17 DIAGNOSIS — F203 Undifferentiated schizophrenia: Secondary | ICD-10-CM

## 2020-08-17 DIAGNOSIS — F331 Major depressive disorder, recurrent, moderate: Secondary | ICD-10-CM

## 2020-08-17 DIAGNOSIS — F5105 Insomnia due to other mental disorder: Secondary | ICD-10-CM

## 2020-08-17 DIAGNOSIS — F411 Generalized anxiety disorder: Secondary | ICD-10-CM

## 2020-08-17 DIAGNOSIS — G2589 Other specified extrapyramidal and movement disorders: Secondary | ICD-10-CM

## 2020-08-17 MED ORDER — LORAZEPAM 1 MG PO TABS: ORAL_TABLET | ORAL | 1 refills | Status: DC

## 2020-08-17 MED ORDER — RISPERIDONE 1 MG PO TABS: ORAL_TABLET | ORAL | 1 refills | Status: DC
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
## 2020-09-07 ENCOUNTER — Encounter: Payer: Self-pay | Admitting: Psychiatry

## 2020-09-07 ENCOUNTER — Other Ambulatory Visit: Payer: Self-pay

## 2020-09-07 ENCOUNTER — Ambulatory Visit (INDEPENDENT_AMBULATORY_CARE_PROVIDER_SITE_OTHER): Payer: Self-pay | Admitting: Psychiatry

## 2020-09-07 DIAGNOSIS — F411 Generalized anxiety disorder: Secondary | ICD-10-CM

## 2020-09-07 DIAGNOSIS — F200 Paranoid schizophrenia: Secondary | ICD-10-CM

## 2020-09-07 DIAGNOSIS — F331 Major depressive disorder, recurrent, moderate: Secondary | ICD-10-CM

## 2020-09-07 DIAGNOSIS — F5105 Insomnia due to other mental disorder: Secondary | ICD-10-CM

## 2020-09-07 MED ORDER — ZIPRASIDONE HCL 60 MG PO CAPS: 60.0000 mg | ORAL_CAPSULE | Freq: Two times a day (BID) | ORAL | 0 refills | Status: DC

## 2020-09-07 MED ORDER — BUPROPION HCL ER (XL) 150 MG PO TB24: 150.0000 mg | ORAL_TABLET | Freq: Every day | ORAL | 1 refills | Status: DC

## 2020-09-07 MED ORDER — LORAZEPAM 1 MG PO TABS: ORAL_TABLET | ORAL | 1 refills | Status: DC

## 2020-09-07 MED ORDER — RISPERIDONE 2 MG PO TABS: ORAL_TABLET | ORAL | 1 refills | Status: DC

## 2020-09-07 NOTE — Patient Instructions (Signed)
Change risperidone to 2 mg in the morning and 4 mg at night. At next refill reduce Geodon to 60 mg twice a day
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
## 2020-09-08 ENCOUNTER — Other Ambulatory Visit: Payer: Self-pay

## 2020-09-08 DIAGNOSIS — F411 Generalized anxiety disorder: Secondary | ICD-10-CM

## 2020-09-08 DIAGNOSIS — F200 Paranoid schizophrenia: Secondary | ICD-10-CM

## 2020-09-08 DIAGNOSIS — F331 Major depressive disorder, recurrent, moderate: Secondary | ICD-10-CM

## 2020-09-08 MED ORDER — LORAZEPAM 1 MG PO TABS: ORAL_TABLET | ORAL | 1 refills | Status: DC

## 2020-09-08 MED ORDER — BUPROPION HCL ER (XL) 150 MG PO TB24: 150.0000 mg | ORAL_TABLET | Freq: Every day | ORAL | 1 refills | Status: DC

## 2020-09-08 MED ORDER — ZIPRASIDONE HCL 60 MG PO CAPS: 60.0000 mg | ORAL_CAPSULE | Freq: Two times a day (BID) | ORAL | 0 refills | Status: DC

## 2020-09-08 MED ORDER — RISPERIDONE 2 MG PO TABS: ORAL_TABLET | ORAL | 1 refills | Status: DC

## 2020-09-28 ENCOUNTER — Encounter: Payer: Self-pay | Admitting: Psychiatry

## 2020-09-28 ENCOUNTER — Other Ambulatory Visit: Payer: Self-pay

## 2020-09-28 ENCOUNTER — Ambulatory Visit (INDEPENDENT_AMBULATORY_CARE_PROVIDER_SITE_OTHER): Payer: Self-pay | Admitting: Psychiatry

## 2020-09-28 DIAGNOSIS — F200 Paranoid schizophrenia: Secondary | ICD-10-CM

## 2020-09-28 DIAGNOSIS — F411 Generalized anxiety disorder: Secondary | ICD-10-CM

## 2020-09-28 MED ORDER — ZIPRASIDONE HCL 60 MG PO CAPS: ORAL_CAPSULE | ORAL | 0 refills | Status: DC

## 2020-09-28 NOTE — Patient Instructions (Addendum)
Increase Geodon to 60 mg in AM with breakfast and 2 of the 60 mg capsules with dinner.  Reduce risperidone 2 tablets at night for 3 nights then 1 tablet at night for 4 nights then stop it.
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
## 2020-10-12 ENCOUNTER — Encounter (INDEPENDENT_AMBULATORY_CARE_PROVIDER_SITE_OTHER): Payer: Self-pay

## 2020-10-12 ENCOUNTER — Encounter: Payer: Self-pay | Admitting: Psychiatry

## 2020-10-12 ENCOUNTER — Other Ambulatory Visit: Payer: Self-pay

## 2020-10-12 ENCOUNTER — Ambulatory Visit (INDEPENDENT_AMBULATORY_CARE_PROVIDER_SITE_OTHER): Payer: Self-pay | Admitting: Psychiatry

## 2020-10-12 DIAGNOSIS — F411 Generalized anxiety disorder: Secondary | ICD-10-CM

## 2020-10-12 DIAGNOSIS — F200 Paranoid schizophrenia: Secondary | ICD-10-CM

## 2020-10-12 DIAGNOSIS — F5105 Insomnia due to other mental disorder: Secondary | ICD-10-CM

## 2020-10-12 DIAGNOSIS — F331 Major depressive disorder, recurrent, moderate: Secondary | ICD-10-CM
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
## 2020-11-02 ENCOUNTER — Ambulatory Visit (INDEPENDENT_AMBULATORY_CARE_PROVIDER_SITE_OTHER): Payer: Self-pay | Admitting: Psychiatry

## 2020-11-02 ENCOUNTER — Other Ambulatory Visit: Payer: Self-pay

## 2020-11-02 ENCOUNTER — Encounter: Payer: Self-pay | Admitting: Psychiatry

## 2020-11-02 DIAGNOSIS — F411 Generalized anxiety disorder: Secondary | ICD-10-CM

## 2020-11-02 DIAGNOSIS — F331 Major depressive disorder, recurrent, moderate: Secondary | ICD-10-CM

## 2020-11-02 MED ORDER — TOPIRAMATE 25 MG PO TABS: ORAL_TABLET | ORAL | 1 refills | Status: DC
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
## 2020-11-07 ENCOUNTER — Other Ambulatory Visit: Payer: Self-pay | Admitting: Psychiatry

## 2020-11-07 DIAGNOSIS — F200 Paranoid schizophrenia: Secondary | ICD-10-CM

## 2020-11-07 DIAGNOSIS — F411 Generalized anxiety disorder: Secondary | ICD-10-CM

## 2020-11-26 ENCOUNTER — Encounter: Payer: Self-pay | Admitting: Psychiatry

## 2020-12-18 ENCOUNTER — Telehealth: Payer: Self-pay | Admitting: Psychiatry

## 2020-12-18 NOTE — Telephone Encounter (Signed)
Noted  

## 2020-12-18 NOTE — Telephone Encounter (Signed)
FYI- pt came in to sign a form to revoke any consent she originally gave for parents, Molly Maduro and Crislyn Willbanks.  We are NOT AUTHORIZED to speak with them regarding her PHI. Effective 12/18/20.

## 2021-01-01 ENCOUNTER — Ambulatory Visit (INDEPENDENT_AMBULATORY_CARE_PROVIDER_SITE_OTHER): Payer: Self-pay | Admitting: Psychiatry

## 2021-01-01 ENCOUNTER — Other Ambulatory Visit: Payer: Self-pay

## 2021-01-01 ENCOUNTER — Encounter: Payer: Self-pay | Admitting: Psychiatry

## 2021-01-01 DIAGNOSIS — F331 Major depressive disorder, recurrent, moderate: Secondary | ICD-10-CM

## 2021-01-01 DIAGNOSIS — F200 Paranoid schizophrenia: Secondary | ICD-10-CM

## 2021-01-01 DIAGNOSIS — F411 Generalized anxiety disorder: Secondary | ICD-10-CM

## 2021-01-01 MED ORDER — LORAZEPAM 1 MG PO TABS: ORAL_TABLET | ORAL | 1 refills | Status: DC

## 2021-01-01 MED ORDER — ZIPRASIDONE HCL 60 MG PO CAPS: ORAL_CAPSULE | ORAL | 2 refills | Status: DC

## 2021-01-01 MED ORDER — BUPROPION HCL ER (XL) 150 MG PO TB24: 150.0000 mg | ORAL_TABLET | Freq: Every day | ORAL | 1 refills | Status: DC

## 2021-01-01 MED ORDER — TOPIRAMATE 50 MG PO TABS: ORAL_TABLET | ORAL | 1 refills | Status: DC
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
## 2021-01-30 ENCOUNTER — Telehealth: Payer: Self-pay | Admitting: Psychiatry

## 2021-01-30 NOTE — Telephone Encounter (Signed)
Pt called to report she's having a lot of anxiety and has taken 4 Geodon in a day, but not going to take until Provider advises. Please advise pt if ok to take 4/d in lieu of 3/d. Contact # [phone]. Apt 4/5

## 2021-01-30 NOTE — Telephone Encounter (Signed)
Left message with information but will call back in the morning to make sure she received.     Please add her to cancellation list.

## 2021-01-30 NOTE — Telephone Encounter (Signed)
We cannot go that high in the dose of Geodon.  Have her increase the lorazepam instead to 1 mg 3 times daily and 1:59 at night as needed for sleep.  Also put her on the cancellation list.

## 2021-01-30 NOTE — Telephone Encounter (Signed)
Please review, taking 60 mg Geodon 4/day due to increased anxiety

## 2021-01-31 NOTE — Telephone Encounter (Signed)
Left pt another message with information

## 2021-02-26 ENCOUNTER — Encounter: Payer: Self-pay | Admitting: Psychiatry

## 2021-02-26 ENCOUNTER — Other Ambulatory Visit: Payer: Self-pay

## 2021-02-26 ENCOUNTER — Ambulatory Visit (INDEPENDENT_AMBULATORY_CARE_PROVIDER_SITE_OTHER): Payer: Self-pay | Admitting: Psychiatry

## 2021-02-26 DIAGNOSIS — F200 Paranoid schizophrenia: Secondary | ICD-10-CM
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
## 2021-02-27 ENCOUNTER — Telehealth: Payer: Self-pay | Admitting: Psychiatry

## 2021-02-27 NOTE — Telephone Encounter (Signed)
I spoke with Stacey [name] and let her know.She will start the Caplyta today

## 2021-02-27 NOTE — Telephone Encounter (Signed)
Next visit is 03/15/21. Stacey [name] called the lab where she will get her lab work done. She is self-pay and they told her that it will be $177.00 up front. She can't afford this. Any ideas what she can do? Her phone number is [phone].

## 2021-02-27 NOTE — Telephone Encounter (Signed)
Is there another option for her?

## 2021-02-27 NOTE — Telephone Encounter (Signed)
I was hoping to check her ziprasidone (Geodon) blood level because of poor response.  However the test is too expensive for her.  I gave her samples of Caplyta.  Tell her to start 1 a day and continue the Geodon at the same dose until she sees me.  I had intended to see her in 10 days and gave her a 10-day supply of Caplyta however I think it will be slightly longer before I am able to see her.  Tell her when she gets close to the end of the current supply of samples to come back and pick up more samples of Caplyta. If she benefits from the [facility] we will seek patient assistance for her.

## 2021-02-27 NOTE — Telephone Encounter (Signed)
reviewed

## 2021-03-06 ENCOUNTER — Telehealth: Payer: Self-pay | Admitting: Psychiatry

## 2021-03-06 NOTE — Telephone Encounter (Signed)
Pt would like to know if she can come by tomorrow and pick up samples for Caplyta 42mg  capsules. Please call when available for pick up.

## 2021-03-06 NOTE — Telephone Encounter (Signed)
Samples pulled 1 box. Pt aware.

## 2021-03-15 ENCOUNTER — Ambulatory Visit (INDEPENDENT_AMBULATORY_CARE_PROVIDER_SITE_OTHER): Payer: Self-pay | Admitting: Psychiatry

## 2021-03-15 ENCOUNTER — Encounter: Payer: Self-pay | Admitting: Psychiatry

## 2021-03-15 ENCOUNTER — Other Ambulatory Visit: Payer: Self-pay

## 2021-03-15 DIAGNOSIS — F411 Generalized anxiety disorder: Secondary | ICD-10-CM

## 2021-03-15 DIAGNOSIS — F5105 Insomnia due to other mental disorder: Secondary | ICD-10-CM

## 2021-03-15 DIAGNOSIS — F200 Paranoid schizophrenia: Secondary | ICD-10-CM

## 2021-03-15 DIAGNOSIS — F331 Major depressive disorder, recurrent, moderate: Secondary | ICD-10-CM
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
## 2021-03-18 NOTE — Progress Notes (Signed)
Application for pt. Assistance has been mailed to the pt.  LM for her that is coming to her for her to complete and return to the office.

## 2021-03-19 ENCOUNTER — Other Ambulatory Visit: Payer: Self-pay | Admitting: Psychiatry

## 2021-03-19 DIAGNOSIS — F331 Major depressive disorder, recurrent, moderate: Secondary | ICD-10-CM

## 2021-04-03 ENCOUNTER — Other Ambulatory Visit: Payer: Self-pay | Admitting: Psychiatry

## 2021-04-03 DIAGNOSIS — F411 Generalized anxiety disorder: Secondary | ICD-10-CM

## 2021-04-04 NOTE — Telephone Encounter (Signed)
Apt 5/20

## 2021-04-12 ENCOUNTER — Encounter: Payer: Self-pay | Admitting: Psychiatry

## 2021-04-12 ENCOUNTER — Ambulatory Visit (INDEPENDENT_AMBULATORY_CARE_PROVIDER_SITE_OTHER): Payer: Self-pay | Admitting: Psychiatry

## 2021-04-12 ENCOUNTER — Other Ambulatory Visit: Payer: Self-pay

## 2021-04-12 DIAGNOSIS — F411 Generalized anxiety disorder: Secondary | ICD-10-CM

## 2021-04-12 DIAGNOSIS — F200 Paranoid schizophrenia: Secondary | ICD-10-CM

## 2021-04-12 MED ORDER — ZIPRASIDONE HCL 40 MG PO CAPS: 40.0000 mg | ORAL_CAPSULE | Freq: Two times a day (BID) | ORAL | 0 refills | Status: DC
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
## 2021-05-01 ENCOUNTER — Other Ambulatory Visit: Payer: Self-pay | Admitting: Psychiatry

## 2021-05-01 DIAGNOSIS — F411 Generalized anxiety disorder: Secondary | ICD-10-CM

## 2021-05-01 DIAGNOSIS — F331 Major depressive disorder, recurrent, moderate: Secondary | ICD-10-CM

## 2021-05-06 ENCOUNTER — Ambulatory Visit: Payer: Self-pay | Admitting: Psychiatry

## 2021-05-16 ENCOUNTER — Encounter: Payer: Self-pay | Admitting: Psychiatry

## 2021-05-16 ENCOUNTER — Other Ambulatory Visit: Payer: Self-pay

## 2021-05-16 ENCOUNTER — Ambulatory Visit (INDEPENDENT_AMBULATORY_CARE_PROVIDER_SITE_OTHER): Payer: 59 | Admitting: Psychiatry

## 2021-05-16 DIAGNOSIS — F411 Generalized anxiety disorder: Secondary | ICD-10-CM

## 2021-05-16 DIAGNOSIS — F331 Major depressive disorder, recurrent, moderate: Secondary | ICD-10-CM

## 2021-05-16 DIAGNOSIS — F200 Paranoid schizophrenia: Secondary | ICD-10-CM | POA: Diagnosis not present

## 2021-05-16 DIAGNOSIS — F5105 Insomnia due to other mental disorder: Secondary | ICD-10-CM | POA: Diagnosis not present

## 2021-05-16 DIAGNOSIS — F458 Other somatoform disorders: Secondary | ICD-10-CM

## 2021-05-16 MED ORDER — ZIPRASIDONE HCL 20 MG PO CAPS: 20.0000 mg | ORAL_CAPSULE | Freq: Two times a day (BID) | ORAL | 0 refills | Status: DC

## 2021-05-16 MED ORDER — CLONAZEPAM 0.5 MG PO TABS: ORAL_TABLET | ORAL | 0 refills | Status: DC
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
## 2021-05-16 NOTE — Patient Instructions (Signed)
About the beginning of July reduce the Geodon to 20 mg twice a day  As soon as you are interested, switch lorazepam to clonazepam for anxiety and sleep

## 2021-05-21 ENCOUNTER — Other Ambulatory Visit: Payer: Self-pay | Admitting: Psychiatry

## 2021-05-21 ENCOUNTER — Telehealth: Payer: Self-pay | Admitting: Psychiatry

## 2021-05-21 NOTE — Telephone Encounter (Signed)
Patient lm stating she would like to continue the Lorazepam instead of Xanax. A follow up appointment is scheduled for 7/8. Please advise.

## 2021-05-21 NOTE — Telephone Encounter (Signed)
I need more detail.  She is not on Xanax.  We switched her from lorazepam to clonazepam to see if it would help her anxiety more.  Ask what her concerns are about the clonazepam.  If it is just less effective we could potentially increase the dose.  Please get details.

## 2021-05-21 NOTE — Telephone Encounter (Signed)
Please review

## 2021-05-22 NOTE — Telephone Encounter (Signed)
LVM to return call.

## 2021-05-31 ENCOUNTER — Ambulatory Visit (INDEPENDENT_AMBULATORY_CARE_PROVIDER_SITE_OTHER): Payer: 59 | Admitting: Psychiatry

## 2021-05-31 ENCOUNTER — Other Ambulatory Visit: Payer: Self-pay

## 2021-05-31 ENCOUNTER — Encounter: Payer: Self-pay | Admitting: Psychiatry

## 2021-05-31 DIAGNOSIS — F200 Paranoid schizophrenia: Secondary | ICD-10-CM

## 2021-05-31 DIAGNOSIS — F5105 Insomnia due to other mental disorder: Secondary | ICD-10-CM | POA: Diagnosis not present

## 2021-05-31 DIAGNOSIS — G2589 Other specified extrapyramidal and movement disorders: Secondary | ICD-10-CM

## 2021-05-31 DIAGNOSIS — F411 Generalized anxiety disorder: Secondary | ICD-10-CM

## 2021-05-31 DIAGNOSIS — F331 Major depressive disorder, recurrent, moderate: Secondary | ICD-10-CM

## 2021-05-31 DIAGNOSIS — F458 Other somatoform disorders: Secondary | ICD-10-CM
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
## 2021-05-31 NOTE — Patient Instructions (Addendum)
Reduce Geodon to 1 of 20 mg capsules twice daily Continue Caplyta 1 daily

## 2021-06-11 ENCOUNTER — Telehealth: Payer: Self-pay | Admitting: Psychiatry

## 2021-06-11 ENCOUNTER — Other Ambulatory Visit: Payer: Self-pay

## 2021-06-11 DIAGNOSIS — F200 Paranoid schizophrenia: Secondary | ICD-10-CM

## 2021-06-11 DIAGNOSIS — F411 Generalized anxiety disorder: Secondary | ICD-10-CM

## 2021-06-11 MED ORDER — LORAZEPAM 1 MG PO TABS
ORAL_TABLET | ORAL | 1 refills | Status: DC
Start: 2021-06-11 — End: 2021-08-06

## 2021-06-11 MED ORDER — ZIPRASIDONE HCL 20 MG PO CAPS: 20.0000 mg | ORAL_CAPSULE | Freq: Two times a day (BID) | ORAL | 0 refills | Status: DC

## 2021-06-11 NOTE — Telephone Encounter (Signed)
Bridgett from [employer] called regarding [place] Gange's medications that were called in. She needs clarification on them. Their number is [phone].

## 2021-06-11 NOTE — Telephone Encounter (Signed)
Pended.

## 2021-06-11 NOTE — Telephone Encounter (Signed)
Lameshia called and wants to know the status of her refills on Lorazepam and Geodan. She said the pharmacy has faxed the requests twice and she has called more than once. I dont see a fax for this and dont see any messages from her requesting the refills. Do either of you have a refill request for these medications? Pharmacy is:  [facility] [address], [place] - [address]  Phone:  [phone]  Fax:  [phone]

## 2021-06-11 NOTE — Telephone Encounter (Signed)
Spoke to pharmacy and clarified Rx.

## 2021-06-19 ENCOUNTER — Telehealth: Payer: Self-pay | Admitting: Psychiatry

## 2021-06-19 NOTE — Telephone Encounter (Signed)
Marylene Land patient's mom lm requesting to speak with Traci concerning Prudence's medication. # [id number].

## 2021-06-20 NOTE — Telephone Encounter (Signed)
Pt's Mom was taken off of the consent I believe. Dr. Jennelle Human please advise

## 2021-06-20 NOTE — Telephone Encounter (Signed)
Please review

## 2021-06-23 NOTE — Telephone Encounter (Signed)
I'm not sure but I think she revoked ROI to parents.  I don't know how to check this.

## 2021-06-24 NOTE — Telephone Encounter (Signed)
Thanks for the update.  Pt has ongoing TR paranoia.  We are trying a transition to an injectable since PO meds have failed.  Pt would likely not tolerate clozapine.

## 2021-06-24 NOTE — Telephone Encounter (Signed)
Spoke with Mom on Friday at length concerning pt's condition. Will update phone message with information passed along.

## 2021-06-24 NOTE — Telephone Encounter (Signed)
Pt's Mom, Kai Railsback is not currently on consents to talk to because pt revoked it. I did speak with Mom and let her tell me pt's behavior, Mom is very concerned and needed a lot of reassurance. Marylene Land reports when pt comes in on Friday, August 5 th she will have her sign a release for consent again. Pt does live with her.   Mom reports after pt received the injection at her last office visit, other then that day she has not been out of the house. Mom reports when pt got home from receiving the injection she made comments that the nurse was wearing heels on a dress down day, she also felt her getting the injection was a trick and she shouldn't have gotten it. Mom reports that whatever Dr. Jennelle Human or nurse say she reports back to her Mom, usually an exaggerated version of what was said. Mom also knows pt reports to Dr. Jennelle Human things that are not happening at their home. Mom feels pt's memory is impaired, easily forgetting things that she normally never would; such as passwords. They had to reach out to get her phone reset due to pt not remembering how to get on her phone. On 7/23, Saturday Mom asked pt about taking over her medication. Mom isn't sure she is taking them but pt reports she is. Mom said since taking over her medication she is sleeping so much better. Pt reports to her that her "flow isn't right". Pt is having a lot of paranoia, neighbors play a lot of loud music and that really bothers pt and she gets fixated on the neighbors. Mom reports she has not seen her like this. Pt stays in her room mostly, which she does not even have a TV. Mom reports prior to injection pt was coming out of her room more and even went to a flea market with her parents. Mom reports they are not allowing her to drive at this time and if I felt that was okay. I advised them if her behavior and thoughts are not appropriate she should not be driving. Her parents will drive her to apt on Friday. Mom reports her movements in her  jaw are improved. Pt also needs more Caplyta, she is getting low. Can get samples on Friday at her apt.  Informed Mom I would update Dr. Jennelle Human with information. Mom feels pt needs some type of Case Manger to help them and guide Mom.

## 2021-06-28 ENCOUNTER — Telehealth: Payer: Self-pay | Admitting: Psychiatry

## 2021-06-28 ENCOUNTER — Other Ambulatory Visit: Payer: Self-pay

## 2021-06-28 ENCOUNTER — Encounter: Payer: Self-pay | Admitting: Psychiatry

## 2021-06-28 ENCOUNTER — Ambulatory Visit (INDEPENDENT_AMBULATORY_CARE_PROVIDER_SITE_OTHER): Payer: 59 | Admitting: Psychiatry

## 2021-06-28 DIAGNOSIS — F331 Major depressive disorder, recurrent, moderate: Secondary | ICD-10-CM

## 2021-06-28 DIAGNOSIS — F411 Generalized anxiety disorder: Secondary | ICD-10-CM | POA: Diagnosis not present

## 2021-06-28 MED ORDER — BUPROPION HCL ER (XL) 150 MG PO TB24: 150.0000 mg | ORAL_TABLET | Freq: Every day | ORAL | 1 refills | Status: DC

## 2021-06-28 MED ORDER — SERTRALINE HCL 50 MG PO TABS: ORAL_TABLET | ORAL | 1 refills | Status: DC
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
## 2021-06-28 NOTE — Telephone Encounter (Signed)
Stacey [name], Stacey [name]'s mom called and said that Stacey [name] is having a bad day and getting worse. She states paranoia is getting worse and seeing things that are not there. She is also having memory loss. Stacey [name] number is [phone] or [phone].

## 2021-06-28 NOTE — Patient Instructions (Addendum)
Reduce ziprasidone to 1 each AM for 2 weeks then stop it. Reduce benztropine to 1/2 tablet twice daily for 2 weeks, then stop it Stop Caplyta Start sertraline at 1/2 tablet in the AM for 1 week, then 1 daily

## 2021-06-28 NOTE — Telephone Encounter (Signed)
Stacey [name] has an appt today

## 2021-07-26 ENCOUNTER — Other Ambulatory Visit: Payer: Self-pay

## 2021-07-26 ENCOUNTER — Ambulatory Visit (INDEPENDENT_AMBULATORY_CARE_PROVIDER_SITE_OTHER): Payer: 59

## 2021-07-26 DIAGNOSIS — F203 Undifferentiated schizophrenia: Secondary | ICD-10-CM | POA: Diagnosis not present

## 2021-07-26 NOTE — Progress Notes (Signed)
Nurse Visit:   Pt came in today for her monthly Abilify Maintena 400 mg injection, with her Mother and brought to our treatment room. Both pt and Mom asked what the name of the medication she is getting injected, they report they didn't know. Showed the box and informed them.   Pt given Abilify Maintena 400 mg IM into right upper gluteal area. Pt tolerated well and had no complaints. She reports she is going to set up her next injection in 28 days.   [id number] EXP Apr 2024

## 2021-08-06 ENCOUNTER — Ambulatory Visit (INDEPENDENT_AMBULATORY_CARE_PROVIDER_SITE_OTHER): Payer: 59 | Admitting: Psychiatry

## 2021-08-06 ENCOUNTER — Encounter: Payer: Self-pay | Admitting: Psychiatry

## 2021-08-06 ENCOUNTER — Other Ambulatory Visit: Payer: Self-pay

## 2021-08-06 DIAGNOSIS — F5105 Insomnia due to other mental disorder: Secondary | ICD-10-CM | POA: Diagnosis not present

## 2021-08-06 DIAGNOSIS — F200 Paranoid schizophrenia: Secondary | ICD-10-CM | POA: Diagnosis not present

## 2021-08-06 DIAGNOSIS — F458 Other somatoform disorders: Secondary | ICD-10-CM

## 2021-08-06 DIAGNOSIS — F411 Generalized anxiety disorder: Secondary | ICD-10-CM | POA: Diagnosis not present

## 2021-08-06 DIAGNOSIS — F331 Major depressive disorder, recurrent, moderate: Secondary | ICD-10-CM | POA: Diagnosis not present

## 2021-08-06 MED ORDER — LORAZEPAM 1 MG PO TABS: 1.0000 mg | ORAL_TABLET | Freq: Four times a day (QID) | ORAL | 1 refills | Status: DC

## 2021-08-06 MED ORDER — CYCLOBENZAPRINE HCL 10 MG PO TABS: ORAL_TABLET | ORAL | 1 refills | Status: DC

## 2021-08-06 MED ORDER — TOPIRAMATE 50 MG PO TABS: 50.0000 mg | ORAL_TABLET | Freq: Every day | ORAL | 1 refills | Status: DC

## 2021-08-06 MED ORDER — SERTRALINE HCL 100 MG PO TABS: 100.0000 mg | ORAL_TABLET | Freq: Every day | ORAL | 0 refills | Status: DC

## 2021-08-06 NOTE — Progress Notes (Signed)
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  She is not able to maintain a job now due to the paranoia.    She failed olanzapine and risperidone and higher dose Geodon.  She is very concerned about weight gain.  Her paranoia is a little better after 2 injections of Abilify maintain a 400 mg q. 28 days.  She is getting the medication free at this point.  She is not agitated however.  Her anxiety is still significant but less severe than the last visit after starting sertraline.  She is less depressed than she was at the last visit as well.   Rec pursue disability  She is still having significant bruxism which is bothersome to her.  Change benztropine  bc teeth grinding to cyclobenzaprine 5 mg bid and 10 mg HS  Increase sertraline to 100 mg daily bc still crying a lot, but is less depressed and anxious than before.  Abilify Maintena 400 mg IM q 4 weeks.  Seems to be helping some with less paranoia, but not gone.  Pt agrees to med changes  She wants to  continue lorazepam 1 mg BID and 1-2 mg HS Ativian and avoid trying any other benzodiazepine such as clonazepam which was  suggested previously.    Continue topiramate for HA and weight loss 50 mg daily.  It's helping Disc SE  Limit caffeine to 2 daily.   Discussed potential metabolic side effects associated with atypical antipsychotics, as well as potential risk for movement side effects. Advised pt to contact office if movement side effects occur.   Follow-up 4-weeks  Lynder Parents, MD, DFAPA  Please see After Visit Summary for patient specific instructions.  Future Appointments  Date Time Provider [place]  08/23/2021 10:00 AM CP-NURSE [facility] None  09/10/2021 10:00 AM Cottle, Billey Co., MD [facility] None  10/15/2021 11:00 AM Cottle, Billey Co., MD [facility] None    No orders of the defined types were placed in this encounter.   -------------------------------

## 2021-08-06 NOTE — Patient Instructions (Signed)
  Change benztropine  because of teeth grinding to cyclobenzaprine 5 mg( 1/2 tablet) in AM and dinner and 10 mg (1 tablet) at bedtime  Increase sertraline to 100 mg daily or 2 of the 50 mg tablets

## 2021-08-23 ENCOUNTER — Other Ambulatory Visit: Payer: Self-pay

## 2021-08-23 ENCOUNTER — Ambulatory Visit (INDEPENDENT_AMBULATORY_CARE_PROVIDER_SITE_OTHER): Payer: 59

## 2021-08-23 DIAGNOSIS — F200 Paranoid schizophrenia: Secondary | ICD-10-CM | POA: Diagnosis not present

## 2021-08-23 NOTE — Progress Notes (Signed)
Nurse Note:  Pt arrived for monthly injection of Abilify Maintena 400 mg, pt received IM to her right upper gluteal. Pt has no complaints, she reports she really likes the shot and thinks it works better. She was very pleasant.   Pt scheduled her follow up in 4 weeks, on October 28th.   LOT [id number]

## 2021-09-04 ENCOUNTER — Other Ambulatory Visit: Payer: Self-pay | Admitting: Psychiatry

## 2021-09-04 DIAGNOSIS — F331 Major depressive disorder, recurrent, moderate: Secondary | ICD-10-CM

## 2021-09-10 ENCOUNTER — Other Ambulatory Visit: Payer: Self-pay

## 2021-09-10 ENCOUNTER — Ambulatory Visit (INDEPENDENT_AMBULATORY_CARE_PROVIDER_SITE_OTHER): Payer: 59 | Admitting: Psychiatry

## 2021-09-10 ENCOUNTER — Encounter: Payer: Self-pay | Admitting: Psychiatry

## 2021-09-10 DIAGNOSIS — F5105 Insomnia due to other mental disorder: Secondary | ICD-10-CM

## 2021-09-10 DIAGNOSIS — F458 Other somatoform disorders: Secondary | ICD-10-CM

## 2021-09-10 DIAGNOSIS — F411 Generalized anxiety disorder: Secondary | ICD-10-CM

## 2021-09-10 DIAGNOSIS — G43009 Migraine without aura, not intractable, without status migrainosus: Secondary | ICD-10-CM

## 2021-09-10 DIAGNOSIS — F200 Paranoid schizophrenia: Secondary | ICD-10-CM

## 2021-09-10 DIAGNOSIS — F331 Major depressive disorder, recurrent, moderate: Secondary | ICD-10-CM

## 2021-09-10 DIAGNOSIS — G2589 Other specified extrapyramidal and movement disorders: Secondary | ICD-10-CM

## 2021-09-10 MED ORDER — TOPIRAMATE 50 MG PO TABS: 50.0000 mg | ORAL_TABLET | Freq: Two times a day (BID) | ORAL | 0 refills | Status: DC
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
## 2021-09-18 ENCOUNTER — Ambulatory Visit (INDEPENDENT_AMBULATORY_CARE_PROVIDER_SITE_OTHER): Payer: 59

## 2021-09-18 ENCOUNTER — Other Ambulatory Visit: Payer: Self-pay

## 2021-09-18 DIAGNOSIS — F200 Paranoid schizophrenia: Secondary | ICD-10-CM

## 2021-09-18 NOTE — Progress Notes (Signed)
Nurse Note:    Pt arrived for her monthly injection of Abilify Maintena 400 mg. She was brought back to conference room and has no complaints, she reports this medication is working great for her. Pt received in her right upper gluteal, IM. Pt very pleasant and doing well.  Pt scheduled in 4 weeks for her next injection.  Medication is stored and provided by doctor's office.  LOT [id number] EXP APR 2024

## 2021-09-20 ENCOUNTER — Ambulatory Visit: Payer: 59

## 2021-09-26 ENCOUNTER — Telehealth: Payer: Self-pay | Admitting: Psychiatry

## 2021-09-26 NOTE — Telephone Encounter (Signed)
I do not see the quetiapine on her med list. Unfortunately, I forgot to document that I gave her quetiapine .   I must have given her some samples.  Please verify dose with her.  If she is sleeping too hard means the dose is too high, apparently,  so have her reduce the dose by 50%.  She was having trouble sleeping before quetiapine so it is obviously helping, but a little too well.  Regarding getting the Abilify Maintena injection, I have no objection to her getting it on the day of her visit with me but that needs to be cleared with the nurse, Traci.

## 2021-09-26 NOTE — Telephone Encounter (Signed)
Left pt a message.  Of course she can get the shot at her next apt with Dr. Jennelle Human, I will left her know.

## 2021-09-26 NOTE — Telephone Encounter (Signed)
Note forwarded to Dr. Jennelle Human

## 2021-09-26 NOTE — Telephone Encounter (Signed)
Next visit is 10/15/21. Stacey [name] called and has some questions about her monthly shot of Abilify and about the prescription Quetiapine. She states when she is taking the Quetiapine she feels like she isn't aware of her surroundings. Her phone number is [phone].

## 2021-09-27 NOTE — Telephone Encounter (Signed)
Patient notified, expressed understanding, and is agreeable to plan.

## 2021-09-27 NOTE — Telephone Encounter (Signed)
Routed to Dr. Cottle 

## 2021-09-27 NOTE — Telephone Encounter (Signed)
After talking with the patient and then the patient and her mother together it was determined the medication is probably sertraline, as mom states she is not on quetiapine. It is difficult to get a good history as daughter and mom tell different versions about patient's sleep. Mom states patient rarely sleeps at night and patient says she does sometimes. Patient states the medication in question was increased on 10/5. The directions for the sertraline was to take 1/2 tab for 7 days and then increase to 1 tab.

## 2021-09-27 NOTE — Telephone Encounter (Signed)
Unfortunately I think this is more about the patient's paranoid delusions that her parents are moving things in the room at night while she is asleep then it is about her sleeping too hard.  Sertraline is not particularly sedating.  Tell her she needs more time to adjust to the medication and it should not make her sleep too hard.

## 2021-10-02 ENCOUNTER — Other Ambulatory Visit: Payer: Self-pay | Admitting: Psychiatry

## 2021-10-02 DIAGNOSIS — F331 Major depressive disorder, recurrent, moderate: Secondary | ICD-10-CM

## 2021-10-03 ENCOUNTER — Other Ambulatory Visit: Payer: Self-pay

## 2021-10-03 DIAGNOSIS — F458 Other somatoform disorders: Secondary | ICD-10-CM

## 2021-10-03 DIAGNOSIS — G43009 Migraine without aura, not intractable, without status migrainosus: Secondary | ICD-10-CM

## 2021-10-03 MED ORDER — TOPIRAMATE 100 MG PO TABS: 100.0000 mg | ORAL_TABLET | Freq: Two times a day (BID) | ORAL | 0 refills | Status: DC

## 2021-10-08 ENCOUNTER — Other Ambulatory Visit: Payer: Self-pay

## 2021-10-08 ENCOUNTER — Ambulatory Visit (INDEPENDENT_AMBULATORY_CARE_PROVIDER_SITE_OTHER): Payer: 59

## 2021-10-08 DIAGNOSIS — F200 Paranoid schizophrenia: Secondary | ICD-10-CM
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
## 2021-10-10 ENCOUNTER — Other Ambulatory Visit: Payer: Self-pay | Admitting: Psychiatry

## 2021-10-11 ENCOUNTER — Telehealth: Payer: Self-pay

## 2021-10-11 ENCOUNTER — Other Ambulatory Visit: Payer: Self-pay | Admitting: Psychiatry

## 2021-10-11 MED ORDER — HALOPERIDOL 5 MG PO TABS: 5.0000 mg | ORAL_TABLET | Freq: Every evening | ORAL | 0 refills | Status: DC

## 2021-10-11 MED ORDER — CYCLOBENZAPRINE HCL 10 MG PO TABS: ORAL_TABLET | ORAL | 1 refills | Status: DC

## 2021-10-11 NOTE — Telephone Encounter (Signed)
Patient has had several months of Abilify Maintena 400 mg q. 28 days.  However the paranoia is not improving as would be desired.  Will add Haldol 5 mg tablets 1 nightly.  She would be a good clozapine candidate but is not likely to tolerate the weight gain given her previous statements. Spoke with both the patient and her mother and they agree to the plan.
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
## 2021-10-11 NOTE — Progress Notes (Signed)
Patient has had several months of Abilify Maintena 400 mg q. 28 days.  However the paranoia is not improving as would be desired.  Will add Haldol 5 mg tablets 1 nightly.  She would be a good clozapine candidate but is not likely to tolerate the weight gain given her previous statements. Spoke with both the patient and her mother and they agree to the plan.

## 2021-10-15 ENCOUNTER — Ambulatory Visit: Payer: 59 | Admitting: Psychiatry

## 2021-10-25 ENCOUNTER — Ambulatory Visit: Payer: 59

## 2021-11-01 ENCOUNTER — Other Ambulatory Visit: Payer: Self-pay

## 2021-11-01 ENCOUNTER — Ambulatory Visit (INDEPENDENT_AMBULATORY_CARE_PROVIDER_SITE_OTHER): Payer: 59 | Admitting: Psychiatry

## 2021-11-01 ENCOUNTER — Encounter: Payer: Self-pay | Admitting: Psychiatry

## 2021-11-01 DIAGNOSIS — F331 Major depressive disorder, recurrent, moderate: Secondary | ICD-10-CM | POA: Diagnosis not present

## 2021-11-01 DIAGNOSIS — F458 Other somatoform disorders: Secondary | ICD-10-CM | POA: Diagnosis not present

## 2021-11-01 DIAGNOSIS — F5105 Insomnia due to other mental disorder: Secondary | ICD-10-CM

## 2021-11-01 DIAGNOSIS — F411 Generalized anxiety disorder: Secondary | ICD-10-CM | POA: Diagnosis not present

## 2021-11-01 DIAGNOSIS — G43009 Migraine without aura, not intractable, without status migrainosus: Secondary | ICD-10-CM

## 2021-11-01 DIAGNOSIS — F200 Paranoid schizophrenia: Secondary | ICD-10-CM

## 2021-11-01 MED ORDER — TOPIRAMATE 100 MG PO TABS: 100.0000 mg | ORAL_TABLET | Freq: Two times a day (BID) | ORAL | 0 refills | Status: DC

## 2021-11-01 MED ORDER — SERTRALINE HCL 100 MG PO TABS: 100.0000 mg | ORAL_TABLET | Freq: Every day | ORAL | 0 refills | Status: DC

## 2021-11-01 MED ORDER — LORAZEPAM 1 MG PO TABS: 1.0000 mg | ORAL_TABLET | Freq: Four times a day (QID) | ORAL | 1 refills | Status: DC

## 2021-11-01 MED ORDER — HALOPERIDOL 5 MG PO TABS: 5.0000 mg | ORAL_TABLET | Freq: Every evening | ORAL | 0 refills | Status: DC

## 2021-11-01 MED ORDER — BUPROPION HCL ER (XL) 150 MG PO TB24: 150.0000 mg | ORAL_TABLET | Freq: Every day | ORAL | 2 refills | Status: DC
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
## 2021-11-20 ENCOUNTER — Other Ambulatory Visit: Payer: Self-pay

## 2021-11-20 ENCOUNTER — Encounter: Payer: Self-pay | Admitting: Psychiatry

## 2021-11-20 ENCOUNTER — Ambulatory Visit (INDEPENDENT_AMBULATORY_CARE_PROVIDER_SITE_OTHER): Payer: 59 | Admitting: Psychiatry

## 2021-11-20 DIAGNOSIS — G43009 Migraine without aura, not intractable, without status migrainosus: Secondary | ICD-10-CM | POA: Diagnosis not present

## 2021-11-20 DIAGNOSIS — F5105 Insomnia due to other mental disorder: Secondary | ICD-10-CM

## 2021-11-20 DIAGNOSIS — F331 Major depressive disorder, recurrent, moderate: Secondary | ICD-10-CM

## 2021-11-20 DIAGNOSIS — F411 Generalized anxiety disorder: Secondary | ICD-10-CM | POA: Diagnosis not present

## 2021-11-20 DIAGNOSIS — F458 Other somatoform disorders: Secondary | ICD-10-CM

## 2021-11-20 DIAGNOSIS — F200 Paranoid schizophrenia: Secondary | ICD-10-CM | POA: Diagnosis not present
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
## 2021-11-22 ENCOUNTER — Other Ambulatory Visit: Payer: Self-pay

## 2021-11-22 ENCOUNTER — Ambulatory Visit (INDEPENDENT_AMBULATORY_CARE_PROVIDER_SITE_OTHER): Payer: 59

## 2021-11-22 DIAGNOSIS — F200 Paranoid schizophrenia: Secondary | ICD-10-CM | POA: Diagnosis not present

## 2021-11-22 NOTE — Progress Notes (Signed)
Pt here today for her starter dose of Aristada Initio 675 mg/2.4 ml  injectable suspension and Aristada  882 mg/3.2 mL injectable suspension. Pt is changing from her Abilify Maintena 400 mg injectable suspension. Pt received 2 injections today, the 675 mg was given in the right upper gluteal area. Instructions for use were reviewed as far as preparation prior to injection. Pt tolerated well, she reports it did sting. The injections do have to be administered rapidly. Pt then received the 882 mg in her left upper gluteal area, tolerated well and she also reports it stung too. Advised her to call back with any questions or concerns. I did write down both medications so she could review herself. She did well and she is aware the next injection will only be 1 (one) and is in 28 days.    Aristad Initio 675 MG LOT # [id number], EXP JUL 2023 Aristada 882 mg LOT# [id number] EXP MAR 2024

## 2021-11-28 NOTE — Progress Notes (Signed)
Noted  

## 2021-12-06 ENCOUNTER — Ambulatory Visit (INDEPENDENT_AMBULATORY_CARE_PROVIDER_SITE_OTHER): Payer: 59 | Admitting: Psychiatry

## 2021-12-06 ENCOUNTER — Other Ambulatory Visit: Payer: Self-pay

## 2021-12-06 ENCOUNTER — Encounter: Payer: Self-pay | Admitting: Psychiatry

## 2021-12-06 DIAGNOSIS — T50905A Adverse effect of unspecified drugs, medicaments and biological substances, initial encounter: Secondary | ICD-10-CM

## 2021-12-06 DIAGNOSIS — F411 Generalized anxiety disorder: Secondary | ICD-10-CM

## 2021-12-06 DIAGNOSIS — G2589 Other specified extrapyramidal and movement disorders: Secondary | ICD-10-CM

## 2021-12-06 DIAGNOSIS — F200 Paranoid schizophrenia: Secondary | ICD-10-CM | POA: Diagnosis not present

## 2021-12-06 DIAGNOSIS — F331 Major depressive disorder, recurrent, moderate: Secondary | ICD-10-CM | POA: Diagnosis not present

## 2021-12-06 DIAGNOSIS — F5105 Insomnia due to other mental disorder: Secondary | ICD-10-CM

## 2021-12-06 DIAGNOSIS — G43009 Migraine without aura, not intractable, without status migrainosus: Secondary | ICD-10-CM

## 2021-12-06 DIAGNOSIS — F458 Other somatoform disorders: Secondary | ICD-10-CM

## 2021-12-06 MED ORDER — HALOPERIDOL 5 MG PO TABS: 7.5000 mg | ORAL_TABLET | Freq: Every evening | ORAL | 1 refills | Status: DC

## 2021-12-06 MED ORDER — CYCLOBENZAPRINE HCL 10 MG PO TABS: 10.0000 mg | ORAL_TABLET | Freq: Three times a day (TID) | ORAL | 1 refills | Status: DC

## 2021-12-06 MED ORDER — ABILIFY MAINTENA 400 MG IM PRSY: 400.0000 mg | PREFILLED_SYRINGE | INTRAMUSCULAR | 5 refills | Status: DC
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
## 2021-12-13 ENCOUNTER — Other Ambulatory Visit: Payer: Self-pay

## 2021-12-13 ENCOUNTER — Ambulatory Visit (INDEPENDENT_AMBULATORY_CARE_PROVIDER_SITE_OTHER): Payer: 59

## 2021-12-13 DIAGNOSIS — F200 Paranoid schizophrenia: Secondary | ICD-10-CM | POA: Diagnosis not present

## 2021-12-13 NOTE — Progress Notes (Signed)
Pt called office today requesting to get her Abilify Maintena injection early, she is not due until next week. She received the Aristada Initio 675 mg/2.4 ml  injectable suspension and Aristada  882 mg/3.2 mL injectable suspension on 11/22/2021.  Unfortunately after receiving that she became very tearful and sad, crying all the time for no reason. Dr. Jennelle Human decided to change her back to Abilify Maintena 400 mg due to her side effects. After discussing with Dr. Jennelle Human this morning, he agreed for her to get it early, she is still crying a lot and having some paranoia. Pt arrived with her mother who stayed in the lobby. Pt given Abilify Maintena 400 mg IM in right upper gluteal area. Pt tolerated well. Pt was not her normal happy/cheerful self, she was very tearful the entire time while I got her injection ready. Not speaking much, very withdrawn. She obviously is realizing something is going on with her requesting the injection early and is being proactive hopefully the symptoms will improve.   Her next apt with Dr. Jennelle Human is 01/03/2022.   LOT [id number] EXP FEB 2024

## 2021-12-20 ENCOUNTER — Ambulatory Visit: Payer: 59

## 2022-01-03 ENCOUNTER — Ambulatory Visit (INDEPENDENT_AMBULATORY_CARE_PROVIDER_SITE_OTHER): Payer: 59 | Admitting: Psychiatry

## 2022-01-03 ENCOUNTER — Encounter: Payer: Self-pay | Admitting: Psychiatry

## 2022-01-03 ENCOUNTER — Ambulatory Visit: Payer: 59 | Admitting: Psychiatry

## 2022-01-03 ENCOUNTER — Other Ambulatory Visit: Payer: Self-pay

## 2022-01-03 DIAGNOSIS — F200 Paranoid schizophrenia: Secondary | ICD-10-CM | POA: Diagnosis not present

## 2022-01-03 DIAGNOSIS — F411 Generalized anxiety disorder: Secondary | ICD-10-CM | POA: Diagnosis not present

## 2022-01-03 MED ORDER — HALOPERIDOL 5 MG PO TABS: 10.0000 mg | ORAL_TABLET | Freq: Every evening | ORAL | 0 refills | Status: DC

## 2022-01-03 MED ORDER — LORAZEPAM 1 MG PO TABS: 1.0000 mg | ORAL_TABLET | Freq: Four times a day (QID) | ORAL | 1 refills | Status: DC
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
## 2022-01-17 ENCOUNTER — Other Ambulatory Visit: Payer: Self-pay

## 2022-01-17 ENCOUNTER — Ambulatory Visit (INDEPENDENT_AMBULATORY_CARE_PROVIDER_SITE_OTHER): Payer: 59 | Admitting: Psychiatry

## 2022-01-17 ENCOUNTER — Encounter: Payer: Self-pay | Admitting: Psychiatry

## 2022-01-17 DIAGNOSIS — F411 Generalized anxiety disorder: Secondary | ICD-10-CM

## 2022-01-17 DIAGNOSIS — G43009 Migraine without aura, not intractable, without status migrainosus: Secondary | ICD-10-CM

## 2022-01-17 DIAGNOSIS — F200 Paranoid schizophrenia: Secondary | ICD-10-CM | POA: Diagnosis not present

## 2022-01-17 DIAGNOSIS — F458 Other somatoform disorders: Secondary | ICD-10-CM

## 2022-01-17 DIAGNOSIS — F331 Major depressive disorder, recurrent, moderate: Secondary | ICD-10-CM | POA: Diagnosis not present

## 2022-01-17 DIAGNOSIS — F5105 Insomnia due to other mental disorder: Secondary | ICD-10-CM
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
## 2022-01-24 ENCOUNTER — Ambulatory Visit (INDEPENDENT_AMBULATORY_CARE_PROVIDER_SITE_OTHER): Payer: 59

## 2022-01-24 ENCOUNTER — Other Ambulatory Visit: Payer: Self-pay

## 2022-01-24 DIAGNOSIS — F200 Paranoid schizophrenia: Secondary | ICD-10-CM

## 2022-01-24 NOTE — Progress Notes (Signed)
Pt arrived at office today for her Abilify Maintena 400 mg injectable, she is receiving every 3 weeks and says she is starting to feel better. This is her second injection since restarting. Pt's Mom waited in lobby. Pt received in her right upper gluteal IM, tolerated well. She sees Dr. Jennelle Human next week again. She does not need any refills today. Instructed to call if she needs anything prior to her apt.  ? ? ?LOT [id number] ?EXP AUG 2024 ?

## 2022-01-31 ENCOUNTER — Other Ambulatory Visit: Payer: Self-pay

## 2022-01-31 ENCOUNTER — Ambulatory Visit (INDEPENDENT_AMBULATORY_CARE_PROVIDER_SITE_OTHER): Payer: 59 | Admitting: Psychiatry

## 2022-01-31 ENCOUNTER — Encounter: Payer: Self-pay | Admitting: Psychiatry

## 2022-01-31 DIAGNOSIS — F458 Other somatoform disorders: Secondary | ICD-10-CM

## 2022-01-31 DIAGNOSIS — G43009 Migraine without aura, not intractable, without status migrainosus: Secondary | ICD-10-CM

## 2022-01-31 DIAGNOSIS — F411 Generalized anxiety disorder: Secondary | ICD-10-CM

## 2022-01-31 DIAGNOSIS — F5105 Insomnia due to other mental disorder: Secondary | ICD-10-CM

## 2022-01-31 DIAGNOSIS — F200 Paranoid schizophrenia: Secondary | ICD-10-CM

## 2022-01-31 DIAGNOSIS — F331 Major depressive disorder, recurrent, moderate: Secondary | ICD-10-CM

## 2022-01-31 MED ORDER — HALOPERIDOL 5 MG PO TABS: 15.0000 mg | ORAL_TABLET | Freq: Every evening | ORAL | 1 refills | Status: DC

## 2022-01-31 MED ORDER — BUPROPION HCL ER (XL) 300 MG PO TB24: 300.0000 mg | ORAL_TABLET | Freq: Every day | ORAL | 1 refills | Status: DC

## 2022-01-31 MED ORDER — TOPIRAMATE 100 MG PO TABS: 100.0000 mg | ORAL_TABLET | Freq: Two times a day (BID) | ORAL | 0 refills | Status: DC

## 2022-01-31 NOTE — Patient Instructions (Signed)
Increase Wellbutrin to 300 for depression ?Increase haloperidol to 15 mg or 3 of the 5 mg tablets at night ?
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
## 2022-02-07 ENCOUNTER — Other Ambulatory Visit: Payer: Self-pay | Admitting: Psychiatry

## 2022-02-13 ENCOUNTER — Other Ambulatory Visit: Payer: Self-pay

## 2022-02-13 ENCOUNTER — Ambulatory Visit (INDEPENDENT_AMBULATORY_CARE_PROVIDER_SITE_OTHER): Payer: 59

## 2022-02-13 DIAGNOSIS — F200 Paranoid schizophrenia: Secondary | ICD-10-CM

## 2022-02-13 DIAGNOSIS — F411 Generalized anxiety disorder: Secondary | ICD-10-CM

## 2022-02-13 MED ORDER — SERTRALINE HCL 100 MG PO TABS: 100.0000 mg | ORAL_TABLET | Freq: Every day | ORAL | 0 refills | Status: DC

## 2022-02-13 NOTE — Progress Notes (Signed)
Nurse Note: ? ?Pt arrived for her Abilify Maintena 400 mg IM, her last dose was on 01/24/2022. She was smiling, laughing, looked beautifully dressed. She received IM in right upper gluteal, tolerated well. Her Mom waited in lobby. She asked for a refill of her Sertraline 100 mg. Rx sent to [facility] as requested for a 90 day supply. I asked pt how she was feeling and she reports much better, she definitely presents that way. Also asked if it's wearing off sooner then her 3 week time frame and she reports it does some. Instructed to call if she needs anything or has questions or concerns.  ? ? ?LOT [id number] ?EXP AUG 2024 ?

## 2022-02-14 ENCOUNTER — Ambulatory Visit: Payer: 59

## 2022-02-21 ENCOUNTER — Encounter: Payer: Self-pay | Admitting: Psychiatry

## 2022-02-21 ENCOUNTER — Ambulatory Visit (INDEPENDENT_AMBULATORY_CARE_PROVIDER_SITE_OTHER): Payer: 59 | Admitting: Psychiatry

## 2022-02-21 DIAGNOSIS — F331 Major depressive disorder, recurrent, moderate: Secondary | ICD-10-CM | POA: Diagnosis not present

## 2022-02-21 DIAGNOSIS — F411 Generalized anxiety disorder: Secondary | ICD-10-CM

## 2022-02-21 DIAGNOSIS — F200 Paranoid schizophrenia: Secondary | ICD-10-CM | POA: Diagnosis not present

## 2022-02-21 DIAGNOSIS — F5105 Insomnia due to other mental disorder: Secondary | ICD-10-CM

## 2022-02-21 DIAGNOSIS — G43009 Migraine without aura, not intractable, without status migrainosus: Secondary | ICD-10-CM | POA: Diagnosis not present
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
## 2022-03-03 ENCOUNTER — Ambulatory Visit: Payer: 59

## 2022-03-04 ENCOUNTER — Ambulatory Visit (INDEPENDENT_AMBULATORY_CARE_PROVIDER_SITE_OTHER): Payer: 59

## 2022-03-04 DIAGNOSIS — F200 Paranoid schizophrenia: Secondary | ICD-10-CM

## 2022-03-04 NOTE — Progress Notes (Signed)
Nurse Note: ?  ?Pt arrived for her Abilify Maintena 400 mg IM, her last dose was on 02/13/2022. Her dose has been increased to every 2 weeks temporarily but with the holiday she didn't get at 2 weeks. She received IM in right upper gluteal, tolerated well. Her Mom waited in lobby. Voiced no complaints, was quiet. She didn't have make up on today which is unusual for her. Instructed her to set up apt on 03/25 for next injection but if her follow up with Dr. Jennelle Human was around that time we could do it then to save a trip. She agreed. ?  ?  ?LOT [id number] ?EXP AUG 2024 ?

## 2022-03-08 ENCOUNTER — Other Ambulatory Visit: Payer: Self-pay | Admitting: Psychiatry

## 2022-03-08 DIAGNOSIS — F411 Generalized anxiety disorder: Secondary | ICD-10-CM

## 2022-03-10 NOTE — Telephone Encounter (Signed)
Please send

## 2022-03-18 ENCOUNTER — Ambulatory Visit: Payer: 59

## 2022-03-19 ENCOUNTER — Ambulatory Visit: Payer: 59

## 2022-03-20 ENCOUNTER — Telehealth: Payer: Self-pay | Admitting: Psychiatry

## 2022-03-20 ENCOUNTER — Other Ambulatory Visit: Payer: Self-pay

## 2022-03-20 DIAGNOSIS — F200 Paranoid schizophrenia: Secondary | ICD-10-CM

## 2022-03-20 MED ORDER — ABILIFY MAINTENA 400 MG IM PRSY: 400.0000 mg | PREFILLED_SYRINGE | INTRAMUSCULAR | 5 refills | Status: DC

## 2022-03-20 NOTE — Telephone Encounter (Signed)
Spoke with Mom to let her know I was trying to get an injection. She reports pt is not doing well. She's been crying in her room.  ? ?Informed Mom I would call her back in the morning with an update.  ?

## 2022-03-20 NOTE — Telephone Encounter (Signed)
Pt's mother Stacey [name] called to report pt not doing well. Since Tuesday she has gotten worse. Shot due Tuesday and not available. She is in her room crying. Mother asking if anything can be done to speed up getting injection. Call mom @ [phone] LVM     ?

## 2022-03-20 NOTE — Telephone Encounter (Signed)
I ordered the injection Abilify Maintenna and sent to [facility] along with information for a co-pay savings card. It will not process until her prior authorization is completed. Will submit that and follow up with [employer] for the cost.  ?

## 2022-03-21 NOTE — Telephone Encounter (Signed)
Response from [employer] with [employer] reports a PA can not be reviewed because medication not on her formulary. I contacted her insurance directly [phone] and they advised me to reach out and submit a request for exception to her benefits. Contact # is [phone]. They advised me to go to their website at [email or web address] and print a request form that needs to include any clinical documentation with why pt medically needs medication, any past medications tried, and any other information that would assist in getting the medication added to her formulary and approved. Once received by their dept it will be reviewed with a turn around time of 72 hours.  ? ?Will submit all this information asap but as of right now pt is not able to receive an injection today due to unavailability.  ? ?Will contact Mom with information.  ?

## 2022-03-24 NOTE — Telephone Encounter (Signed)
Stacey [name], would this pt be able to apply for pt. Assistance on her Abilify Maintenna?  ?

## 2022-03-25 ENCOUNTER — Ambulatory Visit (INDEPENDENT_AMBULATORY_CARE_PROVIDER_SITE_OTHER): Payer: 59

## 2022-03-25 DIAGNOSIS — F200 Paranoid schizophrenia: Secondary | ICD-10-CM | POA: Diagnosis not present

## 2022-03-25 NOTE — Progress Notes (Signed)
Nurse Note: ? ? ?Pt arrived for her injection of Abilify Maintena 400 mg. Pt was suppose to receive last Tuesday, April 25th per Dr. Clovis Pu ordering every 2 weeks. Unfortunately there was no medication available at office. An order was placed with the Pharmaceutical Rep but delivery did not come until today, May 2nd. Pt received injection in her Right upper gluteal area IM and tolerated well. Pt reports she has not been out of the house for a week, doing poorly. Mom reported she has been crying and staying in her room. Pt scheduled next injection for May 16th, Tuesday for her 2 week regimen. Pt also will see Dr. Clovis Pu this Friday, May 5th for follow up.  ? ?LOT [id number] ?EXP JUL 2025 ?

## 2022-03-26 ENCOUNTER — Telehealth: Payer: Self-pay

## 2022-03-26 NOTE — Telephone Encounter (Signed)
Medication Management with [employer] ?

## 2022-03-27 NOTE — Telephone Encounter (Signed)
If we can get it once monthly from insurance maybe we can get samples enough to do every 2 weeks for about 8 weeks to see if she will get better.  If not we will need to change meds and that is not likely to go well initially ?

## 2022-03-27 NOTE — Telephone Encounter (Signed)
I submitted a request to get ABILIFY MAINTENA 400 MG IM added to pt's formulary list but the response back was DENIED. This does not meet medical necessity to be given every 2 weeks (per Dr. Casimiro Needle note), there has to be at least 26 days separated by each dose. Therefore this request did not get approved.  ?

## 2022-03-27 NOTE — Telephone Encounter (Signed)
No, I did try to do once a month. I did not put that in for my request but they said your office note said every 2 weeks.  ?

## 2022-03-28 ENCOUNTER — Ambulatory Visit (INDEPENDENT_AMBULATORY_CARE_PROVIDER_SITE_OTHER): Payer: 59 | Admitting: Psychiatry

## 2022-03-28 ENCOUNTER — Telehealth: Payer: Self-pay

## 2022-03-28 ENCOUNTER — Encounter: Payer: Self-pay | Admitting: Psychiatry

## 2022-03-28 DIAGNOSIS — F5105 Insomnia due to other mental disorder: Secondary | ICD-10-CM

## 2022-03-28 DIAGNOSIS — Z79899 Other long term (current) drug therapy: Secondary | ICD-10-CM | POA: Diagnosis not present

## 2022-03-28 DIAGNOSIS — F411 Generalized anxiety disorder: Secondary | ICD-10-CM

## 2022-03-28 DIAGNOSIS — F331 Major depressive disorder, recurrent, moderate: Secondary | ICD-10-CM

## 2022-03-28 DIAGNOSIS — F458 Other somatoform disorders: Secondary | ICD-10-CM

## 2022-03-28 DIAGNOSIS — F200 Paranoid schizophrenia: Secondary | ICD-10-CM | POA: Diagnosis not present

## 2022-03-28 DIAGNOSIS — G43009 Migraine without aura, not intractable, without status migrainosus: Secondary | ICD-10-CM

## 2022-03-28 MED ORDER — CLOZAPINE 25 MG PO TABS: ORAL_TABLET | ORAL | 1 refills | Status: DC

## 2022-03-28 NOTE — Telephone Encounter (Signed)
Pt has been ordered to get a CBC with diff to begin her treatment taking clozapine. Her Rx has been submitted to [facility] and I did confirm they are [facility] certified. Pt has not been enrolled yet in [employer]S so we will get that completed today and also fax [facility] she needs weekly CBC with Diff.  ?
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
## 2022-03-31 ENCOUNTER — Other Ambulatory Visit: Payer: Self-pay | Admitting: Psychiatry

## 2022-03-31 DIAGNOSIS — F331 Major depressive disorder, recurrent, moderate: Secondary | ICD-10-CM

## 2022-03-31 NOTE — Telephone Encounter (Signed)
I called Stacey [name] this morning and had to LM for her to get her labs done today so she could be enter into the program for medication.  I asked her to call the office to let us know when she had to labs completed. ?

## 2022-03-31 NOTE — Telephone Encounter (Signed)
We are stopping the Abilify Maintena.  So do not apply for patient assistance ?

## 2022-04-01 LAB — CBC WITH DIFFERENTIAL/PLATELET
Absolute Monocytes: 469 cells/uL (ref 200–950)
Basophils Absolute: 27 cells/uL (ref 0–200)
Basophils Relative: 0.4 %
Eosinophils Absolute: 347 cells/uL (ref 15–500)
Eosinophils Relative: 5.1 %
HCT: 36.7 % (ref 35.0–45.0)
Hemoglobin: 12.1 g/dL (ref 11.7–15.5)
Lymphs Abs: 2237 cells/uL (ref 850–3900)
MCH: 31.3 pg (ref 27.0–33.0)
MCHC: 33 g/dL (ref 32.0–36.0)
MCV: 94.8 fL (ref 80.0–100.0)
MPV: 9.8 fL (ref 7.5–12.5)
Monocytes Relative: 6.9 %
Neutro Abs: 3720 cells/uL (ref 1500–7800)
Neutrophils Relative %: 54.7 %
Platelets: 444 10*3/uL — ABNORMAL HIGH (ref 140–400)
RBC: 3.87 10*6/uL (ref 3.80–5.10)
RDW: 12.4 % (ref 11.0–15.0)
Total Lymphocyte: 32.9 %
WBC: 6.8 10*3/uL (ref 3.8–10.8)

## 2022-04-07 ENCOUNTER — Other Ambulatory Visit: Payer: Self-pay | Admitting: Psychiatry

## 2022-04-07 LAB — CBC WITH DIFFERENTIAL/PLATELET
Absolute Monocytes: 575 cells/uL (ref 200–950)
Basophils Absolute: 41 cells/uL (ref 0–200)
Basophils Relative: 0.5 %
Eosinophils Absolute: 348 cells/uL (ref 15–500)
Eosinophils Relative: 4.3 %
HCT: 39.7 % (ref 35.0–45.0)
Hemoglobin: 12.9 g/dL (ref 11.7–15.5)
Lymphs Abs: 2187 cells/uL (ref 850–3900)
MCH: 30.2 pg (ref 27.0–33.0)
MCHC: 32.5 g/dL (ref 32.0–36.0)
MCV: 93 fL (ref 80.0–100.0)
MPV: 9.9 fL (ref 7.5–12.5)
Monocytes Relative: 7.1 %
Neutro Abs: 4949 cells/uL (ref 1500–7800)
Neutrophils Relative %: 61.1 %
Platelets: 435 10*3/uL — ABNORMAL HIGH (ref 140–400)
RBC: 4.27 10*6/uL (ref 3.80–5.10)
RDW: 12.2 % (ref 11.0–15.0)
Total Lymphocyte: 27 %
WBC: 8.1 10*3/uL (ref 3.8–10.8)

## 2022-04-07 LAB — TEST AUTHORIZATION: TEST CODE:: 6399

## 2022-04-08 ENCOUNTER — Ambulatory Visit: Payer: 59

## 2022-04-10 ENCOUNTER — Other Ambulatory Visit: Payer: Self-pay | Admitting: Psychiatry

## 2022-04-10 ENCOUNTER — Telehealth: Payer: Self-pay | Admitting: Psychiatry

## 2022-04-10 DIAGNOSIS — F200 Paranoid schizophrenia: Secondary | ICD-10-CM

## 2022-04-10 MED ORDER — CLOZAPINE 25 MG PO TABS: 50.0000 mg | ORAL_TABLET | Freq: Every day | ORAL | 1 refills | Status: DC

## 2022-04-10 NOTE — Telephone Encounter (Signed)
Sent RX clozapine

## 2022-04-10 NOTE — Telephone Encounter (Signed)
I have not received any results.  

## 2022-04-10 NOTE — Telephone Encounter (Signed)
Printed results and gave to [place].

## 2022-04-10 NOTE — Telephone Encounter (Signed)
I apparently misunderstood the message. Did not know results are in epic. Dr. Jennelle Human needs to send a refill for her Clozapine.

## 2022-04-10 NOTE — Telephone Encounter (Signed)
See message from patient in regards to clozapine.

## 2022-04-10 NOTE — Telephone Encounter (Signed)
Pt has gotten her second refill on Clozaril yesterday. She will run out soon since there were #10 and she takes 2 per day. She got her blood test on 5/15 - labs done at [facility]- and needs result updated in [facility]. Please check on this and also send in a new Clozaril RX. Next appt 5/26. [employer]

## 2022-04-15 ENCOUNTER — Other Ambulatory Visit: Payer: Self-pay | Admitting: Psychiatry

## 2022-04-15 DIAGNOSIS — F200 Paranoid schizophrenia: Secondary | ICD-10-CM

## 2022-04-15 LAB — CBC WITH DIFFERENTIAL/PLATELET
Absolute Monocytes: 650 cells/uL (ref 200–950)
Basophils Absolute: 40 cells/uL (ref 0–200)
Basophils Relative: 0.4 %
Eosinophils Absolute: 320 cells/uL (ref 15–500)
Eosinophils Relative: 3.2 %
HCT: 35.2 % (ref 35.0–45.0)
Hemoglobin: 11.4 g/dL — ABNORMAL LOW (ref 11.7–15.5)
Lymphs Abs: 1980 cells/uL (ref 850–3900)
MCH: 30.7 pg (ref 27.0–33.0)
MCHC: 32.4 g/dL (ref 32.0–36.0)
MCV: 94.9 fL (ref 80.0–100.0)
MPV: 9.9 fL (ref 7.5–12.5)
Monocytes Relative: 6.5 %
Neutro Abs: 7010 cells/uL (ref 1500–7800)
Neutrophils Relative %: 70.1 %
Platelets: 440 10*3/uL — ABNORMAL HIGH (ref 140–400)
RBC: 3.71 10*6/uL — ABNORMAL LOW (ref 3.80–5.10)
RDW: 12.1 % (ref 11.0–15.0)
Total Lymphocyte: 19.8 %
WBC: 10 10*3/uL (ref 3.8–10.8)

## 2022-04-15 LAB — TEST AUTHORIZATION
CLIENT CONTACT:: 0
TEST CODE:: 0
TEST NAME:: 0

## 2022-04-18 ENCOUNTER — Ambulatory Visit (INDEPENDENT_AMBULATORY_CARE_PROVIDER_SITE_OTHER): Payer: 59 | Admitting: Psychiatry

## 2022-04-18 ENCOUNTER — Encounter: Payer: Self-pay | Admitting: Psychiatry

## 2022-04-18 DIAGNOSIS — F411 Generalized anxiety disorder: Secondary | ICD-10-CM | POA: Diagnosis not present

## 2022-04-18 DIAGNOSIS — F5105 Insomnia due to other mental disorder: Secondary | ICD-10-CM

## 2022-04-18 DIAGNOSIS — F3342 Major depressive disorder, recurrent, in full remission: Secondary | ICD-10-CM

## 2022-04-18 DIAGNOSIS — Z79899 Other long term (current) drug therapy: Secondary | ICD-10-CM

## 2022-04-18 DIAGNOSIS — F458 Other somatoform disorders: Secondary | ICD-10-CM

## 2022-04-18 DIAGNOSIS — F200 Paranoid schizophrenia: Secondary | ICD-10-CM | POA: Diagnosis not present

## 2022-04-18 MED ORDER — HALOPERIDOL 5 MG PO TABS: ORAL_TABLET | ORAL | 0 refills | Status: DC

## 2022-04-18 MED ORDER — CLOZAPINE 25 MG PO TABS: ORAL_TABLET | ORAL | 0 refills | Status: DC

## 2022-04-18 NOTE — Patient Instructions (Addendum)
Reduce haloperidol to 2 tablets nightly for 1 week then 1 tablet nightly for 1 week then stop it. Increase clozapine to 3 nightly for 1 week, then increase to 4 tablets or 100 mg nightly Call the office in 2 weeks for a change in clozapine
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
## 2022-04-22 ENCOUNTER — Other Ambulatory Visit: Payer: Self-pay | Admitting: Psychiatry

## 2022-04-22 LAB — CBC WITH DIFFERENTIAL/PLATELET
Absolute Monocytes: 589 cells/uL (ref 200–950)
Basophils Absolute: 48 cells/uL (ref 0–200)
Basophils Relative: 0.5 %
Eosinophils Absolute: 456 cells/uL (ref 15–500)
Eosinophils Relative: 4.8 %
HCT: 36.5 % (ref 35.0–45.0)
Hemoglobin: 11.9 g/dL (ref 11.7–15.5)
Lymphs Abs: 2328 cells/uL (ref 850–3900)
MCH: 30.4 pg (ref 27.0–33.0)
MCHC: 32.6 g/dL (ref 32.0–36.0)
MCV: 93.4 fL (ref 80.0–100.0)
MPV: 9.7 fL (ref 7.5–12.5)
Monocytes Relative: 6.2 %
Neutro Abs: 6080 cells/uL (ref 1500–7800)
Neutrophils Relative %: 64 %
Platelets: 534 10*3/uL — ABNORMAL HIGH (ref 140–400)
RBC: 3.91 10*6/uL (ref 3.80–5.10)
RDW: 12.2 % (ref 11.0–15.0)
Total Lymphocyte: 24.5 %
WBC: 9.5 10*3/uL (ref 3.8–10.8)

## 2022-04-22 LAB — TEST AUTHORIZATION: TEST CODE:: 6399

## 2022-04-24 ENCOUNTER — Other Ambulatory Visit: Payer: Self-pay | Admitting: Psychiatry

## 2022-04-24 ENCOUNTER — Telehealth: Payer: Self-pay | Admitting: Psychiatry

## 2022-04-24 ENCOUNTER — Ambulatory Visit: Payer: 59 | Admitting: Psychiatry

## 2022-04-24 DIAGNOSIS — F200 Paranoid schizophrenia: Secondary | ICD-10-CM

## 2022-04-24 MED ORDER — CLOZAPINE 50 MG PO TABS: 100.0000 mg | ORAL_TABLET | Freq: Every day | ORAL | 1 refills | Status: DC

## 2022-04-24 NOTE — Telephone Encounter (Signed)
RX sent for 100 mg as 2 of the 50 mg capsules.  Will increase again to 150 mg soon

## 2022-04-24 NOTE — Telephone Encounter (Signed)
Stacey [name] called to request refill and the new dose of her Clozapine.  She will run out Sunday.  Said the new dose was to be 100mg  1/day.  Please send to [phone]

## 2022-04-24 NOTE — Telephone Encounter (Signed)
Please review clozapine request

## 2022-04-25 NOTE — Telephone Encounter (Signed)
Reviewed thanks! 

## 2022-04-28 ENCOUNTER — Other Ambulatory Visit: Payer: Self-pay | Admitting: Psychiatry

## 2022-04-28 LAB — CBC WITH DIFFERENTIAL/PLATELET
Absolute Monocytes: 569 cells/uL (ref 200–950)
Basophils Absolute: 50 cells/uL (ref 0–200)
Basophils Relative: 0.7 %
Eosinophils Absolute: 382 cells/uL (ref 15–500)
Eosinophils Relative: 5.3 %
HCT: 35.8 % (ref 35.0–45.0)
Hemoglobin: 11.6 g/dL — ABNORMAL LOW (ref 11.7–15.5)
Lymphs Abs: 2232 cells/uL (ref 850–3900)
MCH: 30 pg (ref 27.0–33.0)
MCHC: 32.4 g/dL (ref 32.0–36.0)
MCV: 92.5 fL (ref 80.0–100.0)
MPV: 9.7 fL (ref 7.5–12.5)
Monocytes Relative: 7.9 %
Neutro Abs: 3967 cells/uL (ref 1500–7800)
Neutrophils Relative %: 55.1 %
Platelets: 552 10*3/uL — ABNORMAL HIGH (ref 140–400)
RBC: 3.87 10*6/uL (ref 3.80–5.10)
RDW: 12.3 % (ref 11.0–15.0)
Total Lymphocyte: 31 %
WBC: 7.2 10*3/uL (ref 3.8–10.8)

## 2022-04-28 LAB — TEST AUTHORIZATION

## 2022-05-02 ENCOUNTER — Other Ambulatory Visit: Payer: Self-pay | Admitting: Psychiatry

## 2022-05-02 ENCOUNTER — Encounter: Payer: Self-pay | Admitting: Psychiatry

## 2022-05-02 DIAGNOSIS — F200 Paranoid schizophrenia: Secondary | ICD-10-CM

## 2022-05-02 MED ORDER — CLOZAPINE 50 MG PO TABS: 150.0000 mg | ORAL_TABLET | Freq: Every day | ORAL | 1 refills | Status: DC

## 2022-05-05 ENCOUNTER — Other Ambulatory Visit: Payer: Self-pay | Admitting: Psychiatry

## 2022-05-05 LAB — CBC WITH DIFFERENTIAL/PLATELET
Absolute Monocytes: 539 cells/uL (ref 200–950)
Basophils Absolute: 52 cells/uL (ref 0–200)
Basophils Relative: 0.6 %
Eosinophils Absolute: 331 cells/uL (ref 15–500)
Eosinophils Relative: 3.8 %
HCT: 37.3 % (ref 35.0–45.0)
Hemoglobin: 12.2 g/dL (ref 11.7–15.5)
Lymphs Abs: 2349 cells/uL (ref 850–3900)
MCH: 30.9 pg (ref 27.0–33.0)
MCHC: 32.7 g/dL (ref 32.0–36.0)
MCV: 94.4 fL (ref 80.0–100.0)
MPV: 9.6 fL (ref 7.5–12.5)
Monocytes Relative: 6.2 %
Neutro Abs: 5429 cells/uL (ref 1500–7800)
Neutrophils Relative %: 62.4 %
Platelets: 524 10*3/uL — ABNORMAL HIGH (ref 140–400)
RBC: 3.95 10*6/uL (ref 3.80–5.10)
RDW: 12.3 % (ref 11.0–15.0)
Total Lymphocyte: 27 %
WBC: 8.7 10*3/uL (ref 3.8–10.8)

## 2022-05-05 LAB — TEST AUTHORIZATION

## 2022-05-06 ENCOUNTER — Other Ambulatory Visit: Payer: Self-pay | Admitting: Psychiatry

## 2022-05-06 DIAGNOSIS — F411 Generalized anxiety disorder: Secondary | ICD-10-CM

## 2022-05-09 ENCOUNTER — Ambulatory Visit (INDEPENDENT_AMBULATORY_CARE_PROVIDER_SITE_OTHER): Payer: 59 | Admitting: Psychiatry

## 2022-05-09 ENCOUNTER — Encounter: Payer: Self-pay | Admitting: Psychiatry

## 2022-05-09 DIAGNOSIS — F411 Generalized anxiety disorder: Secondary | ICD-10-CM

## 2022-05-09 DIAGNOSIS — F458 Other somatoform disorders: Secondary | ICD-10-CM | POA: Diagnosis not present

## 2022-05-09 DIAGNOSIS — F200 Paranoid schizophrenia: Secondary | ICD-10-CM

## 2022-05-09 DIAGNOSIS — G43009 Migraine without aura, not intractable, without status migrainosus: Secondary | ICD-10-CM | POA: Diagnosis not present

## 2022-05-09 DIAGNOSIS — F331 Major depressive disorder, recurrent, moderate: Secondary | ICD-10-CM

## 2022-05-09 MED ORDER — CLOZAPINE 50 MG PO TABS: 200.0000 mg | ORAL_TABLET | Freq: Every day | ORAL | 6 refills | Status: DC

## 2022-05-09 MED ORDER — BUPROPION HCL ER (XL) 300 MG PO TB24: 300.0000 mg | ORAL_TABLET | Freq: Every day | ORAL | 2 refills | Status: DC

## 2022-05-09 MED ORDER — SERTRALINE HCL 100 MG PO TABS: 100.0000 mg | ORAL_TABLET | Freq: Every day | ORAL | 1 refills | Status: DC

## 2022-05-09 MED ORDER — TOPIRAMATE 100 MG PO TABS: 100.0000 mg | ORAL_TABLET | Freq: Two times a day (BID) | ORAL | 0 refills | Status: DC
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
## 2022-05-12 ENCOUNTER — Other Ambulatory Visit: Payer: Self-pay | Admitting: Psychiatry

## 2022-05-12 LAB — CBC WITH DIFFERENTIAL/PLATELET
Absolute Monocytes: 757 cells/uL (ref 200–950)
Basophils Absolute: 36 cells/uL (ref 0–200)
Basophils Relative: 0.4 %
Eosinophils Absolute: 365 cells/uL (ref 15–500)
Eosinophils Relative: 4.1 %
HCT: 36 % (ref 35.0–45.0)
Hemoglobin: 11.9 g/dL (ref 11.7–15.5)
Lymphs Abs: 2732 cells/uL (ref 850–3900)
MCH: 31.2 pg (ref 27.0–33.0)
MCHC: 33.1 g/dL (ref 32.0–36.0)
MCV: 94.2 fL (ref 80.0–100.0)
MPV: 9.9 fL (ref 7.5–12.5)
Monocytes Relative: 8.5 %
Neutro Abs: 5011 cells/uL (ref 1500–7800)
Neutrophils Relative %: 56.3 %
Platelets: 424 10*3/uL — ABNORMAL HIGH (ref 140–400)
RBC: 3.82 10*6/uL (ref 3.80–5.10)
RDW: 12.8 % (ref 11.0–15.0)
Total Lymphocyte: 30.7 %
WBC: 8.9 10*3/uL (ref 3.8–10.8)

## 2022-05-12 LAB — TEST AUTHORIZATION: TEST CODE:: 6399

## 2022-05-13 ENCOUNTER — Telehealth: Payer: Self-pay

## 2022-05-13 ENCOUNTER — Other Ambulatory Visit: Payer: Self-pay | Admitting: Psychiatry

## 2022-05-13 ENCOUNTER — Telehealth: Payer: Self-pay | Admitting: Psychiatry

## 2022-05-13 DIAGNOSIS — F200 Paranoid schizophrenia: Secondary | ICD-10-CM

## 2022-05-13 MED ORDER — CLOZAPINE 50 MG PO TABS: 150.0000 mg | ORAL_TABLET | Freq: Every day | ORAL | 3 refills | Status: DC

## 2022-05-13 NOTE — Telephone Encounter (Signed)
Patient is asking for clarification clozapine dosing. Rx says 200 mg total, but note mentions 150. I think that is from previous dosing, but want to be sure.   From 6/16 note:  Increase clozapine  increase to 150 mg clozapine and paranoia and mood is better

## 2022-05-13 NOTE — Telephone Encounter (Signed)
Prior Authorization submitted for CLOZAPINE 50 MG Take 4 tablets at bedtime. #120/30 days is DENIED, plan only allows #90/30 Day. Please note cloZAPine 200MG  Tablet, 120 tablets per 30 days, is available on the formulary without a review.

## 2022-05-13 NOTE — Telephone Encounter (Signed)
Addressed elsewhere 150 mg clozapine

## 2022-05-13 NOTE — Telephone Encounter (Signed)
Called and spoke with mom. Told her to stay on 3 tablets (150 mg) for now. Let her know that insurance was denying 4 tablets currently.

## 2022-05-13 NOTE — Telephone Encounter (Signed)
Patient lvm stating the directions Dr Jennelle Human gave and the package directions on her refill are conflicting. Please advise.

## 2022-05-13 NOTE — Progress Notes (Signed)
DC other clozapine orders since insurance will not approve 4 tablets daily

## 2022-05-13 NOTE — Telephone Encounter (Signed)
Please inform patient that the insurance company would not cover 4 of the 50 mg tablets daily as a prescription.  So the prescription was changed today to 3 of the 50 mg tablets.  The patient was evidently somewhat confused about this.   Please verify that she is taking 3 of the 50 mg tablets of clozapine If she is taking 4 of the clozapine tablets and tolerating it do not change the dose but get back in touch with me and I will prescribe the 100 mg tablets if needed.

## 2022-05-14 NOTE — Telephone Encounter (Signed)
Noted thanks Clarisse Gouge. I shouldn't need to do another PA for this.   Next Rx will need to be 200 mg tablet though

## 2022-05-19 ENCOUNTER — Other Ambulatory Visit: Payer: Self-pay | Admitting: Psychiatry

## 2022-05-19 LAB — CBC WITH DIFFERENTIAL/PLATELET
Absolute Monocytes: 547 cells/uL (ref 200–950)
Basophils Absolute: 39 cells/uL (ref 0–200)
Basophils Relative: 0.5 %
Eosinophils Absolute: 431 cells/uL (ref 15–500)
Eosinophils Relative: 5.6 %
HCT: 36 % (ref 35.0–45.0)
Hemoglobin: 12 g/dL (ref 11.7–15.5)
Lymphs Abs: 1910 cells/uL (ref 850–3900)
MCH: 30.7 pg (ref 27.0–33.0)
MCHC: 33.3 g/dL (ref 32.0–36.0)
MCV: 92.1 fL (ref 80.0–100.0)
MPV: 9.5 fL (ref 7.5–12.5)
Monocytes Relative: 7.1 %
Neutro Abs: 4774 cells/uL (ref 1500–7800)
Neutrophils Relative %: 62 %
Platelets: 463 10*3/uL — ABNORMAL HIGH (ref 140–400)
RBC: 3.91 10*6/uL (ref 3.80–5.10)
RDW: 12.6 % (ref 11.0–15.0)
Total Lymphocyte: 24.8 %
WBC: 7.7 10*3/uL (ref 3.8–10.8)

## 2022-05-26 ENCOUNTER — Other Ambulatory Visit: Payer: Self-pay | Admitting: Psychiatry

## 2022-05-26 LAB — CBC WITH DIFFERENTIAL/PLATELET
Absolute Monocytes: 744 cells/uL (ref 200–950)
Basophils Absolute: 24 cells/uL (ref 0–200)
Basophils Relative: 0.2 %
Eosinophils Absolute: 415 cells/uL (ref 15–500)
Eosinophils Relative: 3.4 %
HCT: 35.3 % (ref 35.0–45.0)
Hemoglobin: 11.4 g/dL — ABNORMAL LOW (ref 11.7–15.5)
Lymphs Abs: 2086 cells/uL (ref 850–3900)
MCH: 30.2 pg (ref 27.0–33.0)
MCHC: 32.3 g/dL (ref 32.0–36.0)
MCV: 93.6 fL (ref 80.0–100.0)
MPV: 9.8 fL (ref 7.5–12.5)
Monocytes Relative: 6.1 %
Neutro Abs: 8930 cells/uL — ABNORMAL HIGH (ref 1500–7800)
Neutrophils Relative %: 73.2 %
Platelets: 438 10*3/uL — ABNORMAL HIGH (ref 140–400)
RBC: 3.77 10*6/uL — ABNORMAL LOW (ref 3.80–5.10)
RDW: 12.5 % (ref 11.0–15.0)
Total Lymphocyte: 17.1 %
WBC: 12.2 10*3/uL — ABNORMAL HIGH (ref 3.8–10.8)

## 2022-05-30 ENCOUNTER — Ambulatory Visit: Payer: 59 | Admitting: Psychiatry

## 2022-05-30 ENCOUNTER — Other Ambulatory Visit: Payer: Self-pay | Admitting: Psychiatry

## 2022-05-30 ENCOUNTER — Telehealth: Payer: Self-pay | Admitting: Psychiatry

## 2022-05-30 DIAGNOSIS — F200 Paranoid schizophrenia: Secondary | ICD-10-CM

## 2022-05-30 MED ORDER — CLOZAPINE 200 MG PO TABS: 200.0000 mg | ORAL_TABLET | Freq: Every day | ORAL | 1 refills | Status: DC

## 2022-05-30 NOTE — Progress Notes (Signed)
Would like to increase dose to get it more into a therapeutic range.  Also there are concerns about cost so changed the prescription to one of the clozapine 200 mg tablets which should make it more affordable.

## 2022-05-30 NOTE — Telephone Encounter (Signed)
Her labs are in [facility] and she has been advised to take 4 of the 50 mg tablets and to call back if not tolerated.

## 2022-05-30 NOTE — Telephone Encounter (Signed)
We had to change Stacey [name]'s appointment for today.  She felt she needed to get in so she is scheduled for next Friday 7/14.  She is concerned about the cost of the medication and labs since she has to get both weekly.She has not been taking 4 of the 50mg /day because insurance won't cover 4/day and she was told to stay at 3/day until further notice.  She was going today or tomorrow to get her refill.  Bases on [name]'s notes I told her to start 4/day so she could report to him if she tolerated the increase.  If she tolerates the increase Dr. will change the prescription and it will be covered.  She understood and would start taking 4/day tonight and report back at her appt 7/14 how it is working.

## 2022-05-30 NOTE — Telephone Encounter (Signed)
Would like to increase dose to get it more into a therapeutic range.  Also there are concerns about cost so changed the prescription to one of the clozapine 200 mg tablets which should make it more affordable. RX sent.  Pls let pt know if she's not already picked up the other one.

## 2022-06-02 ENCOUNTER — Other Ambulatory Visit: Payer: Self-pay | Admitting: Psychiatry

## 2022-06-02 LAB — CBC WITH DIFFERENTIAL/PLATELET
Absolute Monocytes: 593 cells/uL (ref 200–950)
Basophils Absolute: 38 cells/uL (ref 0–200)
Basophils Relative: 0.5 %
Eosinophils Absolute: 433 cells/uL (ref 15–500)
Eosinophils Relative: 5.7 %
HCT: 35.9 % (ref 35.0–45.0)
Hemoglobin: 11.9 g/dL (ref 11.7–15.5)
Lymphs Abs: 2204 cells/uL (ref 850–3900)
MCH: 30.5 pg (ref 27.0–33.0)
MCHC: 33.1 g/dL (ref 32.0–36.0)
MCV: 92.1 fL (ref 80.0–100.0)
MPV: 9.8 fL (ref 7.5–12.5)
Monocytes Relative: 7.8 %
Neutro Abs: 4332 cells/uL (ref 1500–7800)
Neutrophils Relative %: 57 %
Platelets: 497 10*3/uL — ABNORMAL HIGH (ref 140–400)
RBC: 3.9 10*6/uL (ref 3.80–5.10)
RDW: 12.4 % (ref 11.0–15.0)
Total Lymphocyte: 29 %
WBC: 7.6 10*3/uL (ref 3.8–10.8)

## 2022-06-03 NOTE — Telephone Encounter (Signed)
LM to confirm her dose.

## 2022-06-03 NOTE — Telephone Encounter (Signed)
Stacey [name] called back and she has been taking 200 mg daily of clozapine, tolerating well. Pt has apt on Friday, July 14th may need increase at that time.

## 2022-06-06 ENCOUNTER — Other Ambulatory Visit: Payer: Self-pay | Admitting: Psychiatry

## 2022-06-06 ENCOUNTER — Other Ambulatory Visit: Payer: Self-pay

## 2022-06-06 ENCOUNTER — Telehealth: Payer: Self-pay | Admitting: Psychiatry

## 2022-06-06 ENCOUNTER — Ambulatory Visit (INDEPENDENT_AMBULATORY_CARE_PROVIDER_SITE_OTHER): Payer: 59 | Admitting: Psychiatry

## 2022-06-06 ENCOUNTER — Encounter: Payer: Self-pay | Admitting: Psychiatry

## 2022-06-06 DIAGNOSIS — F411 Generalized anxiety disorder: Secondary | ICD-10-CM | POA: Diagnosis not present

## 2022-06-06 DIAGNOSIS — F331 Major depressive disorder, recurrent, moderate: Secondary | ICD-10-CM | POA: Diagnosis not present

## 2022-06-06 DIAGNOSIS — G43009 Migraine without aura, not intractable, without status migrainosus: Secondary | ICD-10-CM

## 2022-06-06 DIAGNOSIS — F458 Other somatoform disorders: Secondary | ICD-10-CM

## 2022-06-06 DIAGNOSIS — F200 Paranoid schizophrenia: Secondary | ICD-10-CM

## 2022-06-06 DIAGNOSIS — F5105 Insomnia due to other mental disorder: Secondary | ICD-10-CM

## 2022-06-06 DIAGNOSIS — Z79899 Other long term (current) drug therapy: Secondary | ICD-10-CM

## 2022-06-06 MED ORDER — CLOZAPINE 200 MG PO TABS: 200.0000 mg | ORAL_TABLET | Freq: Every day | ORAL | 2 refills | Status: DC
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
## 2022-06-06 NOTE — Telephone Encounter (Signed)
Pended.

## 2022-06-06 NOTE — Telephone Encounter (Signed)
Pt forget to tell dr. Jennelle Human that Larraine is low on her ativan. Please send to [facility].

## 2022-06-09 ENCOUNTER — Other Ambulatory Visit: Payer: Self-pay | Admitting: Psychiatry

## 2022-06-09 LAB — CBC WITH DIFFERENTIAL/PLATELET
Absolute Monocytes: 504 cells/uL (ref 200–950)
Basophils Absolute: 29 cells/uL (ref 0–200)
Basophils Relative: 0.4 %
Eosinophils Absolute: 350 cells/uL (ref 15–500)
Eosinophils Relative: 4.8 %
HCT: 36.3 % (ref 35.0–45.0)
Hemoglobin: 11.7 g/dL (ref 11.7–15.5)
Lymphs Abs: 2124 cells/uL (ref 850–3900)
MCH: 30.4 pg (ref 27.0–33.0)
MCHC: 32.2 g/dL (ref 32.0–36.0)
MCV: 94.3 fL (ref 80.0–100.0)
MPV: 10.5 fL (ref 7.5–12.5)
Monocytes Relative: 6.9 %
Neutro Abs: 4292 cells/uL (ref 1500–7800)
Neutrophils Relative %: 58.8 %
Platelets: 398 10*3/uL (ref 140–400)
RBC: 3.85 10*6/uL (ref 3.80–5.10)
RDW: 12.5 % (ref 11.0–15.0)
Total Lymphocyte: 29.1 %
WBC: 7.3 10*3/uL (ref 3.8–10.8)

## 2022-06-09 LAB — TEST AUTHORIZATION

## 2022-06-09 MED ORDER — LORAZEPAM 1 MG PO TABS: 1.0000 mg | ORAL_TABLET | Freq: Four times a day (QID) | ORAL | 2 refills | Status: DC

## 2022-06-13 ENCOUNTER — Other Ambulatory Visit: Payer: Self-pay | Admitting: Psychiatry

## 2022-06-13 ENCOUNTER — Telehealth: Payer: Self-pay

## 2022-06-13 DIAGNOSIS — F200 Paranoid schizophrenia: Secondary | ICD-10-CM

## 2022-06-13 MED ORDER — CLOZAPINE 100 MG PO TABS: 300.0000 mg | ORAL_TABLET | Freq: Every day | ORAL | 1 refills | Status: DC

## 2022-06-13 NOTE — Telephone Encounter (Signed)
Agreed that dosage of clozapine needs to be increased to 300 mg HS from 200 mg HS.  Sent in RX 100 mg tabs, 3 q HS

## 2022-06-13 NOTE — Telephone Encounter (Signed)
Rtc to pt and she reports she is not doing well this week, difficulty going out in public, very anxious, rapid speech on phone, lots of anxiety on phone. She is asking if her medication can be increased? Informed her I would discuss with Dr. Jennelle Human and call her back.

## 2022-06-13 NOTE — Telephone Encounter (Signed)
Pt and Mom aware of increase.

## 2022-06-16 ENCOUNTER — Other Ambulatory Visit: Payer: Self-pay | Admitting: Psychiatry

## 2022-06-16 LAB — CBC WITH DIFFERENTIAL/PLATELET
Absolute Monocytes: 529 cells/uL (ref 200–950)
Basophils Absolute: 34 cells/uL (ref 0–200)
Basophils Relative: 0.4 %
Eosinophils Absolute: 202 cells/uL (ref 15–500)
Eosinophils Relative: 2.4 %
HCT: 37 % (ref 35.0–45.0)
Hemoglobin: 12.2 g/dL (ref 11.7–15.5)
Lymphs Abs: 2117 cells/uL (ref 850–3900)
MCH: 30.6 pg (ref 27.0–33.0)
MCHC: 33 g/dL (ref 32.0–36.0)
MCV: 92.7 fL (ref 80.0–100.0)
MPV: 10.2 fL (ref 7.5–12.5)
Monocytes Relative: 6.3 %
Neutro Abs: 5519 cells/uL (ref 1500–7800)
Neutrophils Relative %: 65.7 %
Platelets: 416 10*3/uL — ABNORMAL HIGH (ref 140–400)
RBC: 3.99 10*6/uL (ref 3.80–5.10)
RDW: 12.7 % (ref 11.0–15.0)
Total Lymphocyte: 25.2 %
WBC: 8.4 10*3/uL (ref 3.8–10.8)

## 2022-06-16 LAB — TEST AUTHORIZATION
CLIENT CONTACT:: 0
TEST CODE:: 0
TEST NAME:: 0

## 2022-06-20 ENCOUNTER — Encounter: Payer: Self-pay | Admitting: Psychiatry

## 2022-06-20 ENCOUNTER — Ambulatory Visit (INDEPENDENT_AMBULATORY_CARE_PROVIDER_SITE_OTHER): Payer: 59 | Admitting: Psychiatry

## 2022-06-20 DIAGNOSIS — F5105 Insomnia due to other mental disorder: Secondary | ICD-10-CM

## 2022-06-20 DIAGNOSIS — F200 Paranoid schizophrenia: Secondary | ICD-10-CM | POA: Diagnosis not present

## 2022-06-20 DIAGNOSIS — F411 Generalized anxiety disorder: Secondary | ICD-10-CM | POA: Diagnosis not present

## 2022-06-20 DIAGNOSIS — G43009 Migraine without aura, not intractable, without status migrainosus: Secondary | ICD-10-CM

## 2022-06-20 DIAGNOSIS — F458 Other somatoform disorders: Secondary | ICD-10-CM | POA: Diagnosis not present

## 2022-06-20 DIAGNOSIS — F331 Major depressive disorder, recurrent, moderate: Secondary | ICD-10-CM | POA: Diagnosis not present

## 2022-06-20 DIAGNOSIS — Z79899 Other long term (current) drug therapy: Secondary | ICD-10-CM

## 2022-06-20 MED ORDER — CLOZAPINE 200 MG PO TABS: 400.0000 mg | ORAL_TABLET | Freq: Every day | ORAL | 3 refills | Status: DC
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
## 2022-06-23 ENCOUNTER — Other Ambulatory Visit: Payer: Self-pay | Admitting: Psychiatry

## 2022-06-23 LAB — CBC WITH DIFFERENTIAL/PLATELET
Absolute Monocytes: 490 cells/uL (ref 200–950)
Basophils Absolute: 36 cells/uL (ref 0–200)
Basophils Relative: 0.4 %
Eosinophils Absolute: 196 cells/uL (ref 15–500)
Eosinophils Relative: 2.2 %
HCT: 38 % (ref 35.0–45.0)
Hemoglobin: 12.4 g/dL (ref 11.7–15.5)
Lymphs Abs: 2118 cells/uL (ref 850–3900)
MCH: 30.5 pg (ref 27.0–33.0)
MCHC: 32.6 g/dL (ref 32.0–36.0)
MCV: 93.4 fL (ref 80.0–100.0)
MPV: 10 fL (ref 7.5–12.5)
Monocytes Relative: 5.5 %
Neutro Abs: 6061 cells/uL (ref 1500–7800)
Neutrophils Relative %: 68.1 %
Platelets: 434 10*3/uL — ABNORMAL HIGH (ref 140–400)
RBC: 4.07 10*6/uL (ref 3.80–5.10)
RDW: 12.8 % (ref 11.0–15.0)
Total Lymphocyte: 23.8 %
WBC: 8.9 10*3/uL (ref 3.8–10.8)

## 2022-06-23 LAB — TEST AUTHORIZATION
CLIENT CONTACT:: 0
TEST CODE:: 0
TEST NAME:: 0

## 2022-06-26 ENCOUNTER — Other Ambulatory Visit: Payer: Self-pay | Admitting: Psychiatry

## 2022-06-26 ENCOUNTER — Telehealth: Payer: Self-pay | Admitting: Psychiatry

## 2022-06-26 NOTE — Telephone Encounter (Signed)
Pt informed she did pick up 200 mg

## 2022-06-26 NOTE — Telephone Encounter (Signed)
She should be on a total of 400 mg daily now as of the last appointment.  I do not know if she is still using 100 mg tablets or if she has picked up the 200 mg tablets.  I think 200 mg during the day will probably make her too sleepy feeler.  She can take 100 mg during the day at any time she chooses and take the 300 mg at night.

## 2022-06-26 NOTE — Telephone Encounter (Signed)
Please review

## 2022-06-26 NOTE — Telephone Encounter (Signed)
Pt called and said that the clozapine that she takes at night wears off to quickly. So she wants to know if she can take one pill at 2 pm and the other pill at bedtime. She said that she needs something throughout the day. Please give her a call at [phone]

## 2022-07-02 ENCOUNTER — Telehealth: Payer: Self-pay

## 2022-07-02 NOTE — Telephone Encounter (Signed)
Prior Authorization submitted and approved for CLOZAPINE 200 MG 2/DAY effective 07/02/2022-07/02/2023, PA # [id number] with [facility] Medicaid

## 2022-07-03 ENCOUNTER — Telehealth: Payer: Self-pay | Admitting: Psychiatry

## 2022-07-03 DIAGNOSIS — F200 Paranoid schizophrenia: Secondary | ICD-10-CM

## 2022-07-03 NOTE — Telephone Encounter (Signed)
Pt stated she does not feel good.I asked her what she meant and she stated she is anxious/nervous and feels unwanted at her parents house.She said she is also doing things around the house but just does not feel the need to leave home.She thinks something else should be adjusted

## 2022-07-03 NOTE — Telephone Encounter (Signed)
Pt Lvm at 9:20a.  She said Dr. Jennelle Human "boosted her meds".  But she hasn't left the house and she doesn't want to leave the house.  Pls call her back.  Next appt 9/1

## 2022-07-07 MED ORDER — CLOZAPINE 200 MG PO TABS: 400.0000 mg | ORAL_TABLET | Freq: Every day | ORAL | 3 refills | Status: DC

## 2022-07-07 NOTE — Telephone Encounter (Signed)
Contacted Dr. Jennelle Human and gave him pt's symptoms and concerns, he recommends she increase her Clozapine 200 mg take 2.5 tablets daily to equal 500 mg total.   Will contact pt with recommendation. Spoke with patient and she verbalized understanding of her increase. Will update her Rx. She has had to cancel and reschedule her lab apts because she can't be around people.

## 2022-07-07 NOTE — Telephone Encounter (Signed)
Do I need to do anything Traci?

## 2022-07-07 NOTE — Telephone Encounter (Signed)
Addendum to attached message from 8/10 at 1038 am. Stacey [name] is upset that she still has not heard anything back from the message she left on Friday. She feels like she is going backward instead of forward. Please call her back at [phone].

## 2022-07-07 NOTE — Telephone Encounter (Signed)
Please see message from [facility]

## 2022-07-08 ENCOUNTER — Other Ambulatory Visit: Payer: Self-pay

## 2022-07-08 ENCOUNTER — Telehealth: Payer: Self-pay | Admitting: Psychiatry

## 2022-07-08 ENCOUNTER — Other Ambulatory Visit: Payer: Self-pay | Admitting: Psychiatry

## 2022-07-08 DIAGNOSIS — F200 Paranoid schizophrenia: Secondary | ICD-10-CM

## 2022-07-08 MED ORDER — CLOZAPINE 200 MG PO TABS: ORAL_TABLET | ORAL | 3 refills | Status: DC

## 2022-07-08 NOTE — Telephone Encounter (Signed)
Issues resolved to my knowledge.

## 2022-07-08 NOTE — Telephone Encounter (Signed)
Only 18 tabs were sent is that the correct amount?

## 2022-07-08 NOTE — Telephone Encounter (Signed)
Barbara from [employer] called requesting clarification on the Clozaril. Per mom the dosage is incorrect. She is currently at the pharmacy.  Contact asap # [phone] (pharmacy)

## 2022-07-08 NOTE — Telephone Encounter (Signed)
Yes, that's the right amount for 2.5 tablets daily for a week.

## 2022-07-09 NOTE — Telephone Encounter (Signed)
Clarified dosing with Mom, Stacey [name] pt is taking 100 mg in the am and 400 mg at hs. Total of 500 mg daily. I contacted [facility] to make sure they had the updated Rx and they confirmed they did. Informed them pt had enough left for 1 dose so needs refilled by tomorrow.

## 2022-07-09 NOTE — Telephone Encounter (Signed)
Tried reaching mom, Marylene Land a few times and left a voicemail on Radha's number.

## 2022-07-09 NOTE — Telephone Encounter (Signed)
That's correct.

## 2022-07-10 ENCOUNTER — Other Ambulatory Visit: Payer: Self-pay | Admitting: Psychiatry

## 2022-07-10 DIAGNOSIS — Z79899 Other long term (current) drug therapy: Secondary | ICD-10-CM | POA: Diagnosis not present

## 2022-07-10 DIAGNOSIS — R69 Illness, unspecified: Secondary | ICD-10-CM | POA: Diagnosis not present

## 2022-07-10 LAB — CBC WITH DIFFERENTIAL/PLATELET
Absolute Monocytes: 469 cells/uL (ref 200–950)
Basophils Absolute: 43 cells/uL (ref 0–200)
Basophils Relative: 0.6 %
Eosinophils Absolute: 220 cells/uL (ref 15–500)
Eosinophils Relative: 3.1 %
HCT: 38.1 % (ref 35.0–45.0)
Hemoglobin: 12.5 g/dL (ref 11.7–15.5)
Lymphs Abs: 2102 cells/uL (ref 850–3900)
MCH: 30.5 pg (ref 27.0–33.0)
MCHC: 32.8 g/dL (ref 32.0–36.0)
MCV: 92.9 fL (ref 80.0–100.0)
MPV: 10.5 fL (ref 7.5–12.5)
Monocytes Relative: 6.6 %
Neutro Abs: 4267 cells/uL (ref 1500–7800)
Neutrophils Relative %: 60.1 %
Platelets: 381 10*3/uL (ref 140–400)
RBC: 4.1 10*6/uL (ref 3.80–5.10)
RDW: 12.5 % (ref 11.0–15.0)
Total Lymphocyte: 29.6 %
WBC: 7.1 10*3/uL (ref 3.8–10.8)

## 2022-07-10 LAB — TEST AUTHORIZATION

## 2022-07-25 ENCOUNTER — Ambulatory Visit: Payer: 59 | Admitting: Psychiatry

## 2022-07-25 ENCOUNTER — Encounter: Payer: Self-pay | Admitting: Psychiatry

## 2022-07-25 ENCOUNTER — Ambulatory Visit (INDEPENDENT_AMBULATORY_CARE_PROVIDER_SITE_OTHER): Payer: 59 | Admitting: Psychiatry

## 2022-07-25 DIAGNOSIS — G43009 Migraine without aura, not intractable, without status migrainosus: Secondary | ICD-10-CM | POA: Diagnosis not present

## 2022-07-25 DIAGNOSIS — Z79899 Other long term (current) drug therapy: Secondary | ICD-10-CM

## 2022-07-25 DIAGNOSIS — F458 Other somatoform disorders: Secondary | ICD-10-CM

## 2022-07-25 DIAGNOSIS — F331 Major depressive disorder, recurrent, moderate: Secondary | ICD-10-CM

## 2022-07-25 DIAGNOSIS — F200 Paranoid schizophrenia: Secondary | ICD-10-CM

## 2022-07-25 DIAGNOSIS — F411 Generalized anxiety disorder: Secondary | ICD-10-CM | POA: Diagnosis not present

## 2022-07-25 DIAGNOSIS — F5105 Insomnia due to other mental disorder: Secondary | ICD-10-CM

## 2022-07-25 DIAGNOSIS — R69 Illness, unspecified: Secondary | ICD-10-CM | POA: Diagnosis not present

## 2022-07-25 MED ORDER — CLOZAPINE 200 MG PO TABS: ORAL_TABLET | ORAL | 3 refills | Status: DC
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
## 2022-08-05 ENCOUNTER — Other Ambulatory Visit: Payer: Self-pay | Admitting: Psychiatry

## 2022-08-05 DIAGNOSIS — Z79899 Other long term (current) drug therapy: Secondary | ICD-10-CM | POA: Diagnosis not present

## 2022-08-05 DIAGNOSIS — R69 Illness, unspecified: Secondary | ICD-10-CM | POA: Diagnosis not present

## 2022-08-05 LAB — CBC WITH DIFFERENTIAL/PLATELET
Absolute Monocytes: 640 cells/uL (ref 200–950)
Basophils Absolute: 32 cells/uL (ref 0–200)
Basophils Relative: 0.4 %
Eosinophils Absolute: 237 cells/uL (ref 15–500)
Eosinophils Relative: 3 %
HCT: 38.5 % (ref 35.0–45.0)
Hemoglobin: 12.8 g/dL (ref 11.7–15.5)
Lymphs Abs: 2157 cells/uL (ref 850–3900)
MCH: 30.8 pg (ref 27.0–33.0)
MCHC: 33.2 g/dL (ref 32.0–36.0)
MCV: 92.5 fL (ref 80.0–100.0)
MPV: 10.6 fL (ref 7.5–12.5)
Monocytes Relative: 8.1 %
Neutro Abs: 4835 cells/uL (ref 1500–7800)
Neutrophils Relative %: 61.2 %
Platelets: 422 10*3/uL — ABNORMAL HIGH (ref 140–400)
RBC: 4.16 10*6/uL (ref 3.80–5.10)
RDW: 12.7 % (ref 11.0–15.0)
Total Lymphocyte: 27.3 %
WBC: 7.9 10*3/uL (ref 3.8–10.8)

## 2022-08-07 ENCOUNTER — Telehealth: Payer: Self-pay | Admitting: Psychiatry

## 2022-08-07 NOTE — Telephone Encounter (Signed)
Stacey [name] called and said she has no more medicine now as she is waiting to hear about her lab results. She does not want to run out of her meds. They need called in to:  [facility] [address] [place], [place] - [address]  Her phone number is [phone].

## 2022-08-07 NOTE — Telephone Encounter (Signed)
Pt is not getting her labs drawn weekly so she is missing at least 2 weeks of results therefore the pharmacy will not fill her Clozapine. Dr. Jennelle Human is notified and we will discuss options.

## 2022-08-07 NOTE — Telephone Encounter (Signed)
See message.

## 2022-08-07 NOTE — Telephone Encounter (Signed)
Contacted [facility] and he was able to bypass after Dr. Jennelle Human updated information.  Pt is notified and I emphasized the importance of her getting weekly labs, until at least November 5th then we can do every other week. Pt agreed.

## 2022-08-12 ENCOUNTER — Other Ambulatory Visit: Payer: Self-pay | Admitting: Psychiatry

## 2022-08-12 DIAGNOSIS — R69 Illness, unspecified: Secondary | ICD-10-CM | POA: Diagnosis not present

## 2022-08-12 DIAGNOSIS — Z79899 Other long term (current) drug therapy: Secondary | ICD-10-CM | POA: Diagnosis not present

## 2022-08-12 LAB — CBC WITH DIFFERENTIAL/PLATELET
Absolute Monocytes: 960 cells/uL — ABNORMAL HIGH (ref 200–950)
Basophils Absolute: 45 cells/uL (ref 0–200)
Basophils Relative: 0.3 %
Eosinophils Absolute: 225 cells/uL (ref 15–500)
Eosinophils Relative: 1.5 %
HCT: 37.5 % (ref 35.0–45.0)
Hemoglobin: 12.4 g/dL (ref 11.7–15.5)
Lymphs Abs: 2175 cells/uL (ref 850–3900)
MCH: 30.2 pg (ref 27.0–33.0)
MCHC: 33.1 g/dL (ref 32.0–36.0)
MCV: 91.5 fL (ref 80.0–100.0)
MPV: 10.4 fL (ref 7.5–12.5)
Monocytes Relative: 6.4 %
Neutro Abs: 11595 cells/uL — ABNORMAL HIGH (ref 1500–7800)
Neutrophils Relative %: 77.3 %
Platelets: 403 10*3/uL — ABNORMAL HIGH (ref 140–400)
RBC: 4.1 10*6/uL (ref 3.80–5.10)
RDW: 12.9 % (ref 11.0–15.0)
Total Lymphocyte: 14.5 %
WBC: 15 10*3/uL — ABNORMAL HIGH (ref 3.8–10.8)

## 2022-08-19 ENCOUNTER — Other Ambulatory Visit: Payer: Self-pay | Admitting: Psychiatry

## 2022-08-19 DIAGNOSIS — Z79899 Other long term (current) drug therapy: Secondary | ICD-10-CM | POA: Diagnosis not present

## 2022-08-19 DIAGNOSIS — R69 Illness, unspecified: Secondary | ICD-10-CM | POA: Diagnosis not present

## 2022-08-19 LAB — CBC WITH DIFFERENTIAL/PLATELET
Absolute Monocytes: 620 cells/uL (ref 200–950)
Basophils Absolute: 56 cells/uL (ref 0–200)
Basophils Relative: 0.6 %
Eosinophils Absolute: 414 cells/uL (ref 15–500)
Eosinophils Relative: 4.4 %
HCT: 37.8 % (ref 35.0–45.0)
Hemoglobin: 12.1 g/dL (ref 11.7–15.5)
Lymphs Abs: 2200 cells/uL (ref 850–3900)
MCH: 30.1 pg (ref 27.0–33.0)
MCHC: 32 g/dL (ref 32.0–36.0)
MCV: 94 fL (ref 80.0–100.0)
MPV: 10.3 fL (ref 7.5–12.5)
Monocytes Relative: 6.6 %
Neutro Abs: 6110 cells/uL (ref 1500–7800)
Neutrophils Relative %: 65 %
Platelets: 593 10*3/uL — ABNORMAL HIGH (ref 140–400)
RBC: 4.02 10*6/uL (ref 3.80–5.10)
RDW: 12.4 % (ref 11.0–15.0)
Total Lymphocyte: 23.4 %
WBC: 9.4 10*3/uL (ref 3.8–10.8)

## 2022-08-26 ENCOUNTER — Other Ambulatory Visit: Payer: Self-pay | Admitting: Psychiatry

## 2022-08-26 DIAGNOSIS — F200 Paranoid schizophrenia: Secondary | ICD-10-CM

## 2022-08-28 ENCOUNTER — Other Ambulatory Visit: Payer: Self-pay | Admitting: Psychiatry

## 2022-08-28 ENCOUNTER — Telehealth: Payer: Self-pay | Admitting: Psychiatry

## 2022-08-28 DIAGNOSIS — Z79899 Other long term (current) drug therapy: Secondary | ICD-10-CM | POA: Diagnosis not present

## 2022-08-28 DIAGNOSIS — R69 Illness, unspecified: Secondary | ICD-10-CM | POA: Diagnosis not present

## 2022-08-28 NOTE — Telephone Encounter (Signed)
Stacey [name] has been having a lot of anxiety and feels like she's going backwards instead of forward. Wants to know if she can increase her medicine? Phone number is [phone].

## 2022-08-28 NOTE — Telephone Encounter (Signed)
See message from patient. She is talking about clozapine. She said usually someone goes with her "upstairs" to the lab and no one went last time and she was very anxious. She said she is "not living life, I'm in my room. I'm scared to death to go outside." She feels like the clozapine needs to be increased. No other complaints.

## 2022-08-28 NOTE — Telephone Encounter (Signed)
Reviewed. Thanks Shelton Silvas will check her dose.

## 2022-08-29 LAB — CBC WITH DIFFERENTIAL/PLATELET
Absolute Monocytes: 577 cells/uL (ref 200–950)
Basophils Absolute: 47 cells/uL (ref 0–200)
Basophils Relative: 0.6 %
Eosinophils Absolute: 229 cells/uL (ref 15–500)
Eosinophils Relative: 2.9 %
HCT: 38.8 % (ref 35.0–45.0)
Hemoglobin: 12.4 g/dL (ref 11.7–15.5)
Lymphs Abs: 2141 cells/uL (ref 850–3900)
MCH: 30.5 pg (ref 27.0–33.0)
MCHC: 32 g/dL (ref 32.0–36.0)
MCV: 95.3 fL (ref 80.0–100.0)
MPV: 9.9 fL (ref 7.5–12.5)
Monocytes Relative: 7.3 %
Neutro Abs: 4906 cells/uL (ref 1500–7800)
Neutrophils Relative %: 62.1 %
Platelets: 525 10*3/uL — ABNORMAL HIGH (ref 140–400)
RBC: 4.07 10*6/uL (ref 3.80–5.10)
RDW: 13.5 % (ref 11.0–15.0)
Total Lymphocyte: 27.1 %
WBC: 7.9 10*3/uL (ref 3.8–10.8)

## 2022-08-29 NOTE — Telephone Encounter (Signed)
Confirmed with pt taking Clozapine 200 mg 1 tablet and 2 tablets at night. Will discuss with Dr. Clovis Pu  She reports very paranoid, doesn't want to leave the house, feels like she is going 10 steps backwards, and she was in bed when I called at 2:00 pm.   Informed her I would discuss with Dr. Clovis Pu and call her back.

## 2022-09-01 ENCOUNTER — Ambulatory Visit: Payer: 59 | Admitting: Psychiatry

## 2022-09-01 ENCOUNTER — Other Ambulatory Visit: Payer: Self-pay

## 2022-09-01 DIAGNOSIS — F200 Paranoid schizophrenia: Secondary | ICD-10-CM

## 2022-09-01 MED ORDER — CLOZAPINE 100 MG PO TABS: ORAL_TABLET | ORAL | 2 refills | Status: DC

## 2022-09-01 NOTE — Telephone Encounter (Signed)
Spoke with Dr. Clovis Pu and he advises pt to add 100 mg Clozapine at hs to her regular dose. Also add a Clozapine blood level to her labs at [facility].   Pt is notified. New Rx sent to [facility] as well as new lab order.

## 2022-09-02 ENCOUNTER — Telehealth: Payer: Self-pay | Admitting: Psychiatry

## 2022-09-02 ENCOUNTER — Other Ambulatory Visit: Payer: Self-pay | Admitting: Psychiatry

## 2022-09-02 DIAGNOSIS — R69 Illness, unspecified: Secondary | ICD-10-CM | POA: Diagnosis not present

## 2022-09-02 DIAGNOSIS — Z79899 Other long term (current) drug therapy: Secondary | ICD-10-CM | POA: Diagnosis not present

## 2022-09-02 LAB — CBC WITH DIFFERENTIAL/PLATELET
Absolute Monocytes: 679 cells/uL (ref 200–950)
Basophils Absolute: 52 cells/uL (ref 0–200)
Basophils Relative: 0.6 %
Eosinophils Absolute: 249 cells/uL (ref 15–500)
Eosinophils Relative: 2.9 %
HCT: 39.4 % (ref 35.0–45.0)
Hemoglobin: 13 g/dL (ref 11.7–15.5)
Lymphs Abs: 2434 cells/uL (ref 850–3900)
MCH: 30.2 pg (ref 27.0–33.0)
MCHC: 33 g/dL (ref 32.0–36.0)
MCV: 91.4 fL (ref 80.0–100.0)
MPV: 9.7 fL (ref 7.5–12.5)
Monocytes Relative: 7.9 %
Neutro Abs: 5186 cells/uL (ref 1500–7800)
Neutrophils Relative %: 60.3 %
Platelets: 568 10*3/uL — ABNORMAL HIGH (ref 140–400)
RBC: 4.31 10*6/uL (ref 3.80–5.10)
RDW: 13 % (ref 11.0–15.0)
Total Lymphocyte: 28.3 %
WBC: 8.6 10*3/uL (ref 3.8–10.8)

## 2022-09-02 NOTE — Telephone Encounter (Signed)
Stacey [name], [name]'s mom called. [name] spoke to you yesterday regarding her medicines and [name] needs clarification of what to take in the afternoon. Her number is [phone].

## 2022-09-03 NOTE — Telephone Encounter (Signed)
Consulted with nurse and developed this plan as noted.  Will get clozapine level.

## 2022-09-03 NOTE — Telephone Encounter (Signed)
Spoke with pt about her dose and also contacted Levada Dy, Mom but had to leave her a voicemail.  Pt's CBC's have been updated in REMS

## 2022-09-03 NOTE — Telephone Encounter (Signed)
Stacey [name], called back just to verify dosing. They did add the 100 mg to her regimen, she is taking that dose during the day to try and help with anxiety. She wanted to make sure that was okay. Informed her that she could take it during the day. She is struggling with going to get labs drawn and even getting out of her room. Stacey [name] reports she does see improvement but her anxiety, fear and paranoia are the biggest issue during the day. Informed her we should receive the Clozapine Level back in a few days, she may be metabolizing her medication quicker. Informed her I would follow up once I had more information.  Pt doesn't have an apt until November but as long as Dr. Clovis Pu is okay with that and communicating with nurse weekly about dosing they are fine with that. She is on cancellation list. If Dr. Clovis Pu wants to see her just let her know.    Pt taking Clozapine 200 mg in am, 100 mg at 2 pm, and 2 of the 200 mg at hs.

## 2022-09-06 LAB — CLOZAPINE (CLOZARIL)
Clozapine Lvl: 2415 mcg/L — CR
NorClozapine: 1155 mcg/L — ABNORMAL HIGH (ref 25–400)

## 2022-09-06 NOTE — Telephone Encounter (Signed)
TC to pt and mother who administers meds. NO answer with pt or mother and LM. Will try again.  Pt on 700 mg clozapine.  Level is too high.      Latest Reference Range & Units 09/02/22 12:09  NorClozapine 25 - 400 mcg/L 1,155 (H)  (H): Data is abnormally high  Latest Reference Range & Units 09/02/22 12:09  Clozapine Lvl mcg/L 2,415 !!  !!: Data is critical  Lynder Parents, MD, DFAPA

## 2022-09-08 NOTE — Telephone Encounter (Signed)
See message in regards to critical lab value. I think you are already aware of the result.

## 2022-09-08 NOTE — Telephone Encounter (Signed)
TC to [place]  Disc high level. Complaining bad anxiety.  SE drooling.  Not sleeping too much .  Not sleeping enough.  Stop caffeine around 6 pm.  2 cans coke daily.  No tea or coffee.   Not able to go out in public DT anxiety but is getting labs.  Otherwise spending time in her room.  Not suicidal.  Not hearing voices. On 700 mg clozapine.  Reduce clozapine 100 mg in the AM and 200 mg HS.  She wrote this down to share with her mother who administers meds.  Call Thursday and will start fluvoxamine 50 mg for anxiety and augmentation of clozapine.   Will need to repeat level.  Lynder Parents, MD, DFAPA

## 2022-09-08 NOTE — Telephone Encounter (Signed)
Reviewed thanks! 

## 2022-09-08 NOTE — Telephone Encounter (Signed)
Noted-Dr. Clovis Pu notified on 09/06/2022

## 2022-09-08 NOTE — Telephone Encounter (Signed)
Remo Lipps at [facility] @ 10:15 at 9:14p regarding "critical lab results" for the pt.   Ref #[id number].  Pls return the call to  [phone]  Next appt 11/14

## 2022-09-09 ENCOUNTER — Telehealth: Payer: Self-pay | Admitting: Psychiatry

## 2022-09-09 NOTE — Telephone Encounter (Signed)
I do not see the note that I entered yesterday.  But I talked to Stacey [name] on the phone.  Her mother was not home.  Stacey [name] has both 100 mg tablets of clozapine and 200 mg tablets of clozapine and I told her to reduce the dose of clozapine to 100 mg in the morning and 200 mg at night.  That is a total dose of 300 mg/day

## 2022-09-09 NOTE — Telephone Encounter (Signed)
According to the Rx she is on one 200 mg tablet during the day and two at night. Is this still the correct dose with an elevated level?

## 2022-09-09 NOTE — Telephone Encounter (Signed)
Patient lvm with question about medication. She would like to know if she should be taking 1 200mg  tablet a night or two 200mg  tablets a night. Ph: [phone]

## 2022-09-09 NOTE — Telephone Encounter (Signed)
Your previous message:   TC to Stacey [name]   Disc high level. Complaining bad anxiety.  SE drooling.  Not sleeping too much .  Not sleeping enough.  Stop caffeine around 6 pm.  2 cans coke daily.  No tea or coffee.   Not able to go out in public DT anxiety but is getting labs.  Otherwise spending time in her room.  Not suicidal.  Not hearing voices. On 700 mg clozapine.   Reduce clozapine 100 mg in the AM and 200 mg HS.  She wrote this down to share with her mother who administers meds.   Call Thursday and will start fluvoxamine 50 mg for anxiety and augmentation of clozapine.   Will need to repeat level.   Lynder Parents, MD, DFAPA    Rtc to Mom, Stacey [name] and explained the decrease in her dose, total of 300 mg: 100 mg am and 200 mg at night. She is to still get labs drawn this week for Clozapine as well.

## 2022-09-09 NOTE — Telephone Encounter (Signed)
I did read your note as well. I will try to reach [place] or her Mom, Stacey [name]

## 2022-09-10 ENCOUNTER — Other Ambulatory Visit: Payer: Self-pay | Admitting: Psychiatry

## 2022-09-11 ENCOUNTER — Other Ambulatory Visit: Payer: Self-pay | Admitting: Psychiatry

## 2022-09-11 DIAGNOSIS — R69 Illness, unspecified: Secondary | ICD-10-CM | POA: Diagnosis not present

## 2022-09-11 DIAGNOSIS — Z79899 Other long term (current) drug therapy: Secondary | ICD-10-CM | POA: Diagnosis not present

## 2022-09-12 ENCOUNTER — Encounter: Payer: Self-pay | Admitting: Psychiatry

## 2022-09-14 ENCOUNTER — Telehealth: Payer: Self-pay | Admitting: Physician Assistant

## 2022-09-14 LAB — CBC WITH DIFFERENTIAL/PLATELET
Absolute Monocytes: 451 cells/uL (ref 200–950)
Basophils Absolute: 41 cells/uL (ref 0–200)
Basophils Relative: 0.5 %
Eosinophils Absolute: 271 cells/uL (ref 15–500)
Eosinophils Relative: 3.3 %
HCT: 36.4 % (ref 35.0–45.0)
Hemoglobin: 12.2 g/dL (ref 11.7–15.5)
Lymphs Abs: 2198 cells/uL (ref 850–3900)
MCH: 30.1 pg (ref 27.0–33.0)
MCHC: 33.5 g/dL (ref 32.0–36.0)
MCV: 89.9 fL (ref 80.0–100.0)
MPV: 10.3 fL (ref 7.5–12.5)
Monocytes Relative: 5.5 %
Neutro Abs: 5240 cells/uL (ref 1500–7800)
Neutrophils Relative %: 63.9 %
Platelets: 408 10*3/uL — ABNORMAL HIGH (ref 140–400)
RBC: 4.05 10*6/uL (ref 3.80–5.10)
RDW: 12.9 % (ref 11.0–15.0)
Total Lymphocyte: 26.8 %
WBC: 8.2 10*3/uL (ref 3.8–10.8)

## 2022-09-14 LAB — TEST AUTHORIZATION: TEST CODE:: 1769

## 2022-09-14 LAB — CLOZAPINE (CLOZARIL)
Clozapine Lvl: 1540 mcg/L — CR
NorClozapine: 1083 mcg/L — ABNORMAL HIGH (ref 25–400)

## 2022-09-14 NOTE — Telephone Encounter (Signed)
Received a call from [facility] concerning critical lab drawn 09/11/2022. NorClazapine level 1083, Clozapine 1540.  CBC with differential ANC 5,240.  Only abnormal was platelets of 408  On 09/02/2022 Nor Clozapine level 1155, Clozapine level 2415  Since NorClozapine/Clozapine levels are better than 12 days ago, no action taken on my part.  I will pass this along to Dr. Clovis Pu.

## 2022-09-15 NOTE — Telephone Encounter (Signed)
FYI, pt had only reduced the dose of olanzapine from 700 mg/d to 300 mg /d when lab was drawn.  Needs more time for the level to come down, then we will repeat the level..  no further change indicated yet.

## 2022-09-15 NOTE — Telephone Encounter (Signed)
Reviewed

## 2022-09-20 ENCOUNTER — Other Ambulatory Visit: Payer: Self-pay | Admitting: Psychiatry

## 2022-09-22 NOTE — Telephone Encounter (Signed)
Patient Stacey [name] stating that she is out of refills at pharmacy and needs prescription for Clozapine she also states she would like to be bumped up too 400mg . Ph: [place] 11/14 [facility]

## 2022-09-22 NOTE — Telephone Encounter (Signed)
Please see message from patient in regards to clozapine.

## 2022-09-23 ENCOUNTER — Other Ambulatory Visit: Payer: Self-pay | Admitting: Psychiatry

## 2022-09-23 DIAGNOSIS — Z79899 Other long term (current) drug therapy: Secondary | ICD-10-CM | POA: Diagnosis not present

## 2022-09-23 DIAGNOSIS — F200 Paranoid schizophrenia: Secondary | ICD-10-CM

## 2022-09-23 DIAGNOSIS — R69 Illness, unspecified: Secondary | ICD-10-CM | POA: Diagnosis not present

## 2022-09-23 LAB — CBC WITH DIFFERENTIAL/PLATELET
Absolute Monocytes: 664 cells/uL (ref 200–950)
Basophils Absolute: 42 cells/uL (ref 0–200)
Basophils Relative: 0.5 %
Eosinophils Absolute: 291 cells/uL (ref 15–500)
Eosinophils Relative: 3.5 %
HCT: 35.6 % (ref 35.0–45.0)
Hemoglobin: 11.6 g/dL — ABNORMAL LOW (ref 11.7–15.5)
Lymphs Abs: 2415 cells/uL (ref 850–3900)
MCH: 30.4 pg (ref 27.0–33.0)
MCHC: 32.6 g/dL (ref 32.0–36.0)
MCV: 93.2 fL (ref 80.0–100.0)
MPV: 10.4 fL (ref 7.5–12.5)
Monocytes Relative: 8 %
Neutro Abs: 4889 cells/uL (ref 1500–7800)
Neutrophils Relative %: 58.9 %
Platelets: 427 10*3/uL — ABNORMAL HIGH (ref 140–400)
RBC: 3.82 10*6/uL (ref 3.80–5.10)
RDW: 13.1 % (ref 11.0–15.0)
Total Lymphocyte: 29.1 %
WBC: 8.3 10*3/uL (ref 3.8–10.8)

## 2022-09-23 MED ORDER — FLUVOXAMINE MALEATE 50 MG PO TABS: 50.0000 mg | ORAL_TABLET | Freq: Every day | ORAL | 1 refills | Status: DC

## 2022-09-23 MED ORDER — CLOZAPINE 100 MG PO TABS: ORAL_TABLET | ORAL | 1 refills | Status: DC

## 2022-09-23 NOTE — Progress Notes (Signed)
Rtc to [place], Mom and explained medication regimen and discussed how it would be working for her. She verbalized understanding.   She also reports today is the last day for every week labs, she started in May and she is asking about going to every other week. Informed her I would make sure that was okay and we would have to update [facility] if it is.  She is aware I will call her back tomorrow. Pt did have labs drawn today.

## 2022-09-23 NOTE — Progress Notes (Signed)
Due to an inadequate response to clozapine even at higher dosage we have reduced the dose of clozapine and will attempt augmentation with fluvoxamine 50 mg nightly.  Patient and her mother are aware that this was going to be done.  Start fluvoxamine 1 tablet at night along with clozapine 100 mg tablets 1 in the morning and 2 tablets at night Fluvoxamine rX sent to pharmacy

## 2022-09-23 NOTE — Telephone Encounter (Signed)
Pt's last labs were 10/19. Unable to update REMS, pt is going to get labs drawn today 09/23/2022

## 2022-09-23 NOTE — Progress Notes (Signed)
Agreed to change Rx quantity to 2 week intervals. And to change CBC to every 2 weeks also.

## 2022-09-23 NOTE — Progress Notes (Signed)
Traci to call mom.

## 2022-09-26 NOTE — Progress Notes (Signed)
Mom and pt are aware of update

## 2022-09-29 ENCOUNTER — Telehealth: Payer: Self-pay | Admitting: Psychiatry

## 2022-09-29 ENCOUNTER — Other Ambulatory Visit: Payer: Self-pay | Admitting: Psychiatry

## 2022-09-29 DIAGNOSIS — F331 Major depressive disorder, recurrent, moderate: Secondary | ICD-10-CM

## 2022-09-29 NOTE — Telephone Encounter (Signed)
Rtc but Mom, Levada Dy wasn't there so Stacey [name] wants me to call back in about an hour.

## 2022-09-29 NOTE — Telephone Encounter (Signed)
Next visit is 10/07/22. Selinda Eon, Cicily's mother called. She is listed on the DPR to speak to. She states Jalyne does not feel well with the medication she is taking. She was put on Fluoxetine last Tuesday and she feels like she needs a bump up on something. Please call her mom Levada Dy at [phone].

## 2022-09-29 NOTE — Telephone Encounter (Signed)
Her anxiety and fearfulness is an ongoing problem.  She has not had time to benefit from the fluvoxamine which we added just last week.  She needs to give this dosage more time.  It may take 2 or 3 weeks to provide additional benefit.  Perhaps even a little longer No med changes

## 2022-09-29 NOTE — Telephone Encounter (Signed)
Rtc and gave them recommendation to keep things the same. Very appreciative of my call and if they have further issues they will contact the office.

## 2022-09-29 NOTE — Telephone Encounter (Signed)
Rtc to pt and Mom, Stacey [name] and she is reporting feeling worse. She was actually starting to drive some last week, not far but was driving. Now she doesn't want to leave her room, definitely not the house and doesn't want to be around anybody. She added Fluvoxamine 50 mg last Tuesday, October 31st. Also Clozapine 300 mg total daily. Pt of course wants to increase her Clozapine but I had to explain her blood levels and why we were adjusting and lowering her medication. She was interacting on the phone with me, pleasant and very appreciative of me calling.  Informed her and her Mother I would update Dr. Clovis Pu and see if anything can be done. She has a f/u apt with Dr. Clovis Pu on 10/07/2022

## 2022-09-29 NOTE — Telephone Encounter (Signed)
Please see message. °

## 2022-10-07 ENCOUNTER — Encounter: Payer: Self-pay | Admitting: Psychiatry

## 2022-10-07 ENCOUNTER — Ambulatory Visit (INDEPENDENT_AMBULATORY_CARE_PROVIDER_SITE_OTHER): Payer: 59 | Admitting: Psychiatry

## 2022-10-07 DIAGNOSIS — F5105 Insomnia due to other mental disorder: Secondary | ICD-10-CM

## 2022-10-07 DIAGNOSIS — Z79899 Other long term (current) drug therapy: Secondary | ICD-10-CM

## 2022-10-07 DIAGNOSIS — R69 Illness, unspecified: Secondary | ICD-10-CM | POA: Diagnosis not present

## 2022-10-07 DIAGNOSIS — F331 Major depressive disorder, recurrent, moderate: Secondary | ICD-10-CM

## 2022-10-07 DIAGNOSIS — G43009 Migraine without aura, not intractable, without status migrainosus: Secondary | ICD-10-CM

## 2022-10-07 DIAGNOSIS — F200 Paranoid schizophrenia: Secondary | ICD-10-CM

## 2022-10-07 DIAGNOSIS — F411 Generalized anxiety disorder: Secondary | ICD-10-CM | POA: Diagnosis not present

## 2022-10-07 MED ORDER — ARIPIPRAZOLE 15 MG PO TABS: 7.5000 mg | ORAL_TABLET | Freq: Every day | ORAL | 0 refills | Status: DC

## 2022-10-07 MED ORDER — FLUVOXAMINE MALEATE 50 MG PO TABS: 50.0000 mg | ORAL_TABLET | Freq: Two times a day (BID) | ORAL | 0 refills | Status: DC

## 2022-10-07 MED ORDER — CLOZAPINE 100 MG PO TABS: ORAL_TABLET | ORAL | 3 refills | Status: AC
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
## 2022-10-07 NOTE — Patient Instructions (Signed)
Increase fluvoxamine to 50 mg twice daily and reduce sertraline to 1/2 tablet daily for 1 week then stop it. Add aripiprazole 1/2 of 15 mg tablet daily

## 2022-10-09 ENCOUNTER — Other Ambulatory Visit: Payer: Self-pay | Admitting: Psychiatry

## 2022-10-14 DIAGNOSIS — R69 Illness, unspecified: Secondary | ICD-10-CM | POA: Diagnosis not present

## 2022-10-14 LAB — CBC WITH DIFFERENTIAL/PLATELET
Absolute Monocytes: 616 cells/uL (ref 200–950)
Basophils Absolute: 44 cells/uL (ref 0–200)
Basophils Relative: 0.5 %
Eosinophils Absolute: 299 cells/uL (ref 15–500)
Eosinophils Relative: 3.4 %
HCT: 38.4 % (ref 35.0–45.0)
Hemoglobin: 12.6 g/dL (ref 11.7–15.5)
Lymphs Abs: 2376 cells/uL (ref 850–3900)
MCH: 30.1 pg (ref 27.0–33.0)
MCHC: 32.8 g/dL (ref 32.0–36.0)
MCV: 91.9 fL (ref 80.0–100.0)
MPV: 11.1 fL (ref 7.5–12.5)
Monocytes Relative: 7 %
Neutro Abs: 5465 cells/uL (ref 1500–7800)
Neutrophils Relative %: 62.1 %
Platelets: 380 10*3/uL (ref 140–400)
RBC: 4.18 10*6/uL (ref 3.80–5.10)
RDW: 13.2 % (ref 11.0–15.0)
Total Lymphocyte: 27 %
WBC: 8.8 10*3/uL (ref 3.8–10.8)

## 2022-10-22 ENCOUNTER — Telehealth: Payer: Self-pay | Admitting: Psychiatry

## 2022-10-22 NOTE — Telephone Encounter (Signed)
Tried to reach pt with # left but unable to get through.

## 2022-10-22 NOTE — Telephone Encounter (Signed)
Stacey [name] called requesting to speak to Traci. She has been on her new medication for two weeks and is frustrating with all of it. She feels like she is going backwards instead of forward. She only goes out when she sees Dr. Jennelle Human and sees him on 11/07/22. Please call her at [phone].

## 2022-10-22 NOTE — Telephone Encounter (Signed)
Noted. Will contact pt.

## 2022-10-23 ENCOUNTER — Telehealth: Payer: Self-pay | Admitting: Psychiatry

## 2022-10-23 ENCOUNTER — Other Ambulatory Visit: Payer: Self-pay | Admitting: Psychiatry

## 2022-10-23 NOTE — Telephone Encounter (Signed)
Pt calling back from yesterday. BAD panic attack. Would like to adjust meds. Call cell #[phone]

## 2022-10-23 NOTE — Telephone Encounter (Signed)
See other phone message  

## 2022-10-23 NOTE — Telephone Encounter (Signed)
Ok.  Increase fluvoxamine for anxiety to 100 mg AM and 50 mg PM please.  May take a couple of weeks to see benefit with this.

## 2022-10-23 NOTE — Telephone Encounter (Signed)
Will try to reach in the morning. Went to [facility].

## 2022-10-23 NOTE — Telephone Encounter (Signed)
Rtc to pt and her Mom was also on speaker phone, she reports she is having worsening anxiety throughout the day and it can come on quickly. She is still staying in her room , only coming out to eat. She does go to lab visits every 2 weeks but its a struggle. Mom reports pt thinks that they are going to put her in a nursing home, feels like they are talking about her, remains paranoid.   Mom gives meds 9:30 am- Bupropion XL 300 mg, Clozapine 100 mg, lorazepam 1 mg, flexeril 10 mg. 3PM- lorazepam 1 mg, flexeril 10 mg, Fluvoxamine 50 mg, Abilify 15 mg 1/2 tablet.  9PM Lorazepam 2 mg total, Flexeril 10 mg, Fluvoxamine 50 mg.

## 2022-10-24 ENCOUNTER — Other Ambulatory Visit: Payer: Self-pay

## 2022-10-24 DIAGNOSIS — F200 Paranoid schizophrenia: Secondary | ICD-10-CM

## 2022-10-24 DIAGNOSIS — F411 Generalized anxiety disorder: Secondary | ICD-10-CM

## 2022-10-24 DIAGNOSIS — F331 Major depressive disorder, recurrent, moderate: Secondary | ICD-10-CM

## 2022-10-24 MED ORDER — FLUVOXAMINE MALEATE 50 MG PO TABS: 50.0000 mg | ORAL_TABLET | Freq: Three times a day (TID) | ORAL | 0 refills | Status: DC

## 2022-10-24 NOTE — Telephone Encounter (Signed)
Rtc to Mom and pt and advised her to add 50 mg Fluvoxamine to her dose. She is going to try it as 50 mg am, 50 mg mid day, then 50 mg bedtime to see if that helps her anxiety more. If not she will try 100 mg in the am and 50 mg in pm.  New updated Rx sent to [facility]

## 2022-10-29 ENCOUNTER — Other Ambulatory Visit: Payer: Self-pay | Admitting: Psychiatry

## 2022-10-29 DIAGNOSIS — F331 Major depressive disorder, recurrent, moderate: Secondary | ICD-10-CM

## 2022-11-04 ENCOUNTER — Other Ambulatory Visit: Payer: Self-pay | Admitting: Psychiatry

## 2022-11-04 DIAGNOSIS — F200 Paranoid schizophrenia: Secondary | ICD-10-CM

## 2022-11-05 ENCOUNTER — Other Ambulatory Visit: Payer: Self-pay | Admitting: Psychiatry

## 2022-11-05 DIAGNOSIS — E200 Idiopathic hypoparathyroidism: Secondary | ICD-10-CM | POA: Diagnosis not present

## 2022-11-05 DIAGNOSIS — Z79899 Other long term (current) drug therapy: Secondary | ICD-10-CM | POA: Diagnosis not present

## 2022-11-05 LAB — CBC WITH DIFFERENTIAL/PLATELET
Absolute Monocytes: 517 cells/uL (ref 200–950)
Basophils Absolute: 41 cells/uL (ref 0–200)
Basophils Relative: 0.5 %
Eosinophils Absolute: 139 cells/uL (ref 15–500)
Eosinophils Relative: 1.7 %
HCT: 42 % (ref 35.0–45.0)
Hemoglobin: 13.9 g/dL (ref 11.7–15.5)
Lymphs Abs: 1960 cells/uL (ref 850–3900)
MCH: 30.7 pg (ref 27.0–33.0)
MCHC: 33.1 g/dL (ref 32.0–36.0)
MCV: 92.7 fL (ref 80.0–100.0)
MPV: 10.6 fL (ref 7.5–12.5)
Monocytes Relative: 6.3 %
Neutro Abs: 5543 cells/uL (ref 1500–7800)
Neutrophils Relative %: 67.6 %
Platelets: 461 10*3/uL — ABNORMAL HIGH (ref 140–400)
RBC: 4.53 10*6/uL (ref 3.80–5.10)
RDW: 12.7 % (ref 11.0–15.0)
Total Lymphocyte: 23.9 %
WBC: 8.2 10*3/uL (ref 3.8–10.8)

## 2022-11-07 ENCOUNTER — Ambulatory Visit: Payer: 59 | Admitting: Psychiatry

## 2022-11-07 ENCOUNTER — Encounter: Payer: Self-pay | Admitting: Psychiatry

## 2022-11-07 DIAGNOSIS — F331 Major depressive disorder, recurrent, moderate: Secondary | ICD-10-CM | POA: Diagnosis not present

## 2022-11-07 DIAGNOSIS — F458 Other somatoform disorders: Secondary | ICD-10-CM | POA: Diagnosis not present

## 2022-11-07 DIAGNOSIS — F200 Paranoid schizophrenia: Secondary | ICD-10-CM | POA: Diagnosis not present

## 2022-11-07 DIAGNOSIS — F411 Generalized anxiety disorder: Secondary | ICD-10-CM | POA: Diagnosis not present

## 2022-11-07 DIAGNOSIS — G43009 Migraine without aura, not intractable, without status migrainosus: Secondary | ICD-10-CM

## 2022-11-07 DIAGNOSIS — R69 Illness, unspecified: Secondary | ICD-10-CM | POA: Diagnosis not present

## 2022-11-07 MED ORDER — BUPROPION HCL ER (XL) 300 MG PO TB24: 300.0000 mg | ORAL_TABLET | Freq: Every day | ORAL | 1 refills | Status: AC

## 2022-11-07 MED ORDER — FLUVOXAMINE MALEATE 50 MG PO TABS: 50.0000 mg | ORAL_TABLET | Freq: Three times a day (TID) | ORAL | 0 refills | Status: AC

## 2022-11-07 MED ORDER — TOPIRAMATE 100 MG PO TABS: 100.0000 mg | ORAL_TABLET | Freq: Two times a day (BID) | ORAL | 0 refills | Status: AC

## 2022-11-07 MED ORDER — CYCLOBENZAPRINE HCL 10 MG PO TABS: 10.0000 mg | ORAL_TABLET | Freq: Three times a day (TID) | ORAL | 2 refills | Status: AC

## 2022-11-07 MED ORDER — ARIPIPRAZOLE 15 MG PO TABS: 15.0000 mg | ORAL_TABLET | Freq: Every day | ORAL | 1 refills | Status: DC

## 2022-11-07 MED ORDER — LORAZEPAM 1 MG PO TABS: 1.0000 mg | ORAL_TABLET | Freq: Four times a day (QID) | ORAL | 2 refills | Status: DC

## 2022-11-07 NOTE — Patient Instructions (Signed)
Increase aripiprazole to 1 tablet daily
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
## 2022-11-21 ENCOUNTER — Telehealth: Payer: Self-pay | Admitting: Psychiatry

## 2022-11-21 NOTE — Telephone Encounter (Signed)
Marylene Land called back and reports pt remains very paranoid, she thinks there are people in the house and closet. She was very concerned because Tessa packed a bag and was waiting in the drive way for someone to pick her up. She looked out and saw she was gone but did find her on the side of the house. She is back inside in her room. Marylene Land asking for an adjustment since office is closed on Monday, she is concerned. She reports pt typically worsens in the afternoon but it does happen at other times as well.   Informed her I would discuss with Dr Jennelle Human and call her back.

## 2022-11-21 NOTE — Telephone Encounter (Signed)
Tried to rtc to [place] but had to leave a vm. Will let Dr. Jennelle Human know.

## 2022-11-21 NOTE — Telephone Encounter (Signed)
Stacey [name], Abrish's mom called in a panic. She states she has been struggling and currently has her suitcase packed and is standing outside waiting for the person to pick her up. She also has been seeing people in the house and in her bedroom. Please call mom at [phone]. Mom is listed on the DPR.

## 2022-11-22 ENCOUNTER — Telehealth: Payer: Self-pay | Admitting: Psychiatry

## 2022-11-22 NOTE — Telephone Encounter (Signed)
RTC  Spoke with Kennedi's mother about her persistent psychosis.  She left the house yesterday and her mother could not find her.  She left because of persistent paranoia.  Her mother found her hiding on another side of the house outside.  She has been persistent and consistent with medications but is not responding to treatment.  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  Suggested it was time for her to have referral to someplace like [facility] or [facility] where an expert may be able to help her.  I gave her the name of Dr. Alonza Bogus at [facility] as well as the contact phone number.  I sent Dr. Sherilyn Banker an email describing the situation and asking for an appointment for Stacey [name].  No further med changes at this time.  Meredith Staggers MD, DFAPA

## 2022-11-28 ENCOUNTER — Encounter: Payer: Self-pay | Admitting: Psychiatry

## 2022-11-28 ENCOUNTER — Ambulatory Visit (INDEPENDENT_AMBULATORY_CARE_PROVIDER_SITE_OTHER): Payer: 59 | Admitting: Psychiatry

## 2022-11-28 DIAGNOSIS — F411 Generalized anxiety disorder: Secondary | ICD-10-CM

## 2022-11-28 DIAGNOSIS — Z79899 Other long term (current) drug therapy: Secondary | ICD-10-CM | POA: Diagnosis not present

## 2022-11-28 DIAGNOSIS — F331 Major depressive disorder, recurrent, moderate: Secondary | ICD-10-CM

## 2022-11-28 DIAGNOSIS — G43009 Migraine without aura, not intractable, without status migrainosus: Secondary | ICD-10-CM | POA: Diagnosis not present

## 2022-11-28 DIAGNOSIS — R69 Illness, unspecified: Secondary | ICD-10-CM | POA: Diagnosis not present

## 2022-11-28 DIAGNOSIS — F200 Paranoid schizophrenia: Secondary | ICD-10-CM
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
## 2022-12-02 ENCOUNTER — Other Ambulatory Visit: Payer: Self-pay | Admitting: Psychiatry

## 2022-12-02 DIAGNOSIS — E200 Idiopathic hypoparathyroidism: Secondary | ICD-10-CM | POA: Diagnosis not present

## 2022-12-02 DIAGNOSIS — Z79899 Other long term (current) drug therapy: Secondary | ICD-10-CM | POA: Diagnosis not present

## 2022-12-02 LAB — CBC WITH DIFFERENTIAL/PLATELET
Absolute Monocytes: 428 cells/uL (ref 200–950)
Basophils Absolute: 32 cells/uL (ref 0–200)
Basophils Relative: 0.5 %
Eosinophils Absolute: 139 cells/uL (ref 15–500)
Eosinophils Relative: 2.2 %
HCT: 41.2 % (ref 35.0–45.0)
Hemoglobin: 13.9 g/dL (ref 11.7–15.5)
Lymphs Abs: 1985 cells/uL (ref 850–3900)
MCH: 30.3 pg (ref 27.0–33.0)
MCHC: 33.7 g/dL (ref 32.0–36.0)
MCV: 90 fL (ref 80.0–100.0)
MPV: 11.6 fL (ref 7.5–12.5)
Monocytes Relative: 6.8 %
Neutro Abs: 3717 cells/uL (ref 1500–7800)
Neutrophils Relative %: 59 %
Platelets: 294 10*3/uL (ref 140–400)
RBC: 4.58 10*6/uL (ref 3.80–5.10)
RDW: 13.1 % (ref 11.0–15.0)
Total Lymphocyte: 31.5 %
WBC: 6.3 10*3/uL (ref 3.8–10.8)

## 2022-12-03 ENCOUNTER — Other Ambulatory Visit: Payer: Self-pay

## 2022-12-03 ENCOUNTER — Telehealth: Payer: Self-pay | Admitting: Psychiatry

## 2022-12-03 DIAGNOSIS — F411 Generalized anxiety disorder: Secondary | ICD-10-CM

## 2022-12-03 MED ORDER — LORAZEPAM 1 MG PO TABS: 1.0000 mg | ORAL_TABLET | Freq: Four times a day (QID) | ORAL | 2 refills | Status: AC

## 2022-12-03 NOTE — Telephone Encounter (Signed)
Mom Stacey [name] lvm requesting a refill on the Ativan for Stacey [name]. She stated the bottle states one refill remaining, but pharmacy stated she is out of refills. Levada Dy is requesting the refill at the [facility].  Appointment 12/22/22 Contact Stacey [name] at # [phone]

## 2022-12-03 NOTE — Telephone Encounter (Signed)
Pended.

## 2022-12-16 ENCOUNTER — Other Ambulatory Visit: Payer: Self-pay | Admitting: Psychiatry

## 2022-12-16 DIAGNOSIS — Z79899 Other long term (current) drug therapy: Secondary | ICD-10-CM | POA: Diagnosis not present

## 2022-12-16 DIAGNOSIS — E200 Idiopathic hypoparathyroidism: Secondary | ICD-10-CM | POA: Diagnosis not present

## 2022-12-16 LAB — CBC WITH DIFFERENTIAL/PLATELET
Absolute Monocytes: 513 cells/uL (ref 200–950)
Basophils Absolute: 44 cells/uL (ref 0–200)
Basophils Relative: 0.5 %
Eosinophils Absolute: 139 cells/uL (ref 15–500)
Eosinophils Relative: 1.6 %
HCT: 40.2 % (ref 35.0–45.0)
Hemoglobin: 13.6 g/dL (ref 11.7–15.5)
Lymphs Abs: 1862 cells/uL (ref 850–3900)
MCH: 30.2 pg (ref 27.0–33.0)
MCHC: 33.8 g/dL (ref 32.0–36.0)
MCV: 89.1 fL (ref 80.0–100.0)
MPV: 11.5 fL (ref 7.5–12.5)
Monocytes Relative: 5.9 %
Neutro Abs: 6142 cells/uL (ref 1500–7800)
Neutrophils Relative %: 70.6 %
Platelets: 349 10*3/uL (ref 140–400)
RBC: 4.51 10*6/uL (ref 3.80–5.10)
RDW: 13 % (ref 11.0–15.0)
Total Lymphocyte: 21.4 %
WBC: 8.7 10*3/uL (ref 3.8–10.8)

## 2022-12-22 ENCOUNTER — Ambulatory Visit: Payer: 59 | Admitting: Psychiatry

## 2022-12-31 ENCOUNTER — Other Ambulatory Visit: Payer: Self-pay | Admitting: Psychiatry

## 2022-12-31 DIAGNOSIS — E200 Idiopathic hypoparathyroidism: Secondary | ICD-10-CM | POA: Diagnosis not present

## 2022-12-31 DIAGNOSIS — Z79899 Other long term (current) drug therapy: Secondary | ICD-10-CM | POA: Diagnosis not present

## 2022-12-31 LAB — CBC WITH DIFFERENTIAL/PLATELET
Absolute Monocytes: 561 cells/uL (ref 200–950)
Basophils Absolute: 43 cells/uL (ref 0–200)
Basophils Relative: 0.6 %
Eosinophils Absolute: 78 cells/uL (ref 15–500)
Eosinophils Relative: 1.1 %
HCT: 40.8 % (ref 35.0–45.0)
Hemoglobin: 13.9 g/dL (ref 11.7–15.5)
Lymphs Abs: 1725 cells/uL (ref 850–3900)
MCH: 29.9 pg (ref 27.0–33.0)
MCHC: 34.1 g/dL (ref 32.0–36.0)
MCV: 87.7 fL (ref 80.0–100.0)
MPV: 12.3 fL (ref 7.5–12.5)
Monocytes Relative: 7.9 %
Neutro Abs: 4693 cells/uL (ref 1500–7800)
Neutrophils Relative %: 66.1 %
Platelets: 261 10*3/uL (ref 140–400)
RBC: 4.65 10*6/uL (ref 3.80–5.10)
RDW: 13.1 % (ref 11.0–15.0)
Total Lymphocyte: 24.3 %
WBC: 7.1 10*3/uL (ref 3.8–10.8)

## 2023-01-15 DIAGNOSIS — Z79899 Other long term (current) drug therapy: Secondary | ICD-10-CM | POA: Diagnosis not present

## 2023-01-20 ENCOUNTER — Ambulatory Visit: Payer: 59 | Admitting: Psychiatry

## 2023-04-27 DIAGNOSIS — Z79899 Other long term (current) drug therapy: Secondary | ICD-10-CM | POA: Diagnosis not present

## 2023-04-27 DIAGNOSIS — F200 Paranoid schizophrenia: Secondary | ICD-10-CM | POA: Diagnosis not present

## 2023-04-27 DIAGNOSIS — K5903 Drug induced constipation: Secondary | ICD-10-CM | POA: Diagnosis not present

## 2023-04-27 DIAGNOSIS — E559 Vitamin D deficiency, unspecified: Secondary | ICD-10-CM | POA: Diagnosis not present

## 2023-04-27 DIAGNOSIS — F3341 Major depressive disorder, recurrent, in partial remission: Secondary | ICD-10-CM | POA: Diagnosis not present

## 2023-04-27 DIAGNOSIS — F419 Anxiety disorder, unspecified: Secondary | ICD-10-CM | POA: Diagnosis not present

## 2023-06-22 DIAGNOSIS — F3341 Major depressive disorder, recurrent, in partial remission: Secondary | ICD-10-CM | POA: Diagnosis not present

## 2023-06-22 DIAGNOSIS — Z79899 Other long term (current) drug therapy: Secondary | ICD-10-CM | POA: Diagnosis not present

## 2023-06-22 DIAGNOSIS — F458 Other somatoform disorders: Secondary | ICD-10-CM | POA: Diagnosis not present

## 2023-06-22 DIAGNOSIS — N959 Unspecified menopausal and perimenopausal disorder: Secondary | ICD-10-CM | POA: Diagnosis not present

## 2023-06-22 DIAGNOSIS — F419 Anxiety disorder, unspecified: Secondary | ICD-10-CM | POA: Diagnosis not present

## 2023-06-22 DIAGNOSIS — F209 Schizophrenia, unspecified: Secondary | ICD-10-CM | POA: Diagnosis not present

## 2023-06-22 DIAGNOSIS — F200 Paranoid schizophrenia: Secondary | ICD-10-CM | POA: Diagnosis not present

## 2023-06-22 DIAGNOSIS — E559 Vitamin D deficiency, unspecified: Secondary | ICD-10-CM | POA: Diagnosis not present

## 2023-06-22 DIAGNOSIS — Z975 Presence of (intrauterine) contraceptive device: Secondary | ICD-10-CM | POA: Diagnosis not present

## 2023-07-15 DIAGNOSIS — Z79899 Other long term (current) drug therapy: Secondary | ICD-10-CM | POA: Diagnosis not present

## 2023-12-09 ENCOUNTER — Other Ambulatory Visit: Payer: Self-pay | Admitting: Family Medicine

## 2023-12-09 DIAGNOSIS — Z1231 Encounter for screening mammogram for malignant neoplasm of breast: Secondary | ICD-10-CM

## 2023-12-30 ENCOUNTER — Ambulatory Visit
Admission: RE | Admit: 2023-12-30 | Discharge: 2023-12-30 | Disposition: A | Discharge status: Home / Self Care | Payer: Medicare Other | Source: Ambulatory Visit | Attending: Family Medicine | Admitting: Family Medicine

## 2023-12-30 DIAGNOSIS — Z1231 Encounter for screening mammogram for malignant neoplasm of breast: Secondary | ICD-10-CM
